# Patient Record
Sex: Male | Born: 1970 | Race: White | Hispanic: No | Marital: Married | Smoking: Former smoker
Health system: Southern US, Community
[De-identification: ages and names within clinical notes are randomized; demographics above are authoritative.]

## PROBLEM LIST (undated history)

## (undated) DIAGNOSIS — E785 Hyperlipidemia, unspecified: Secondary | ICD-10-CM

## (undated) HISTORY — PX: KNEE RECONSTRUCTION: SHX5883

## (undated) HISTORY — PX: HERNIA REPAIR: SHX51

---

## 2008-02-06 ENCOUNTER — Encounter: Admission: RE | Admit: 2008-02-06 | Discharge: 2008-02-06 | Payer: Self-pay | Admitting: Orthopedic Surgery

## 2008-02-13 ENCOUNTER — Encounter: Admission: RE | Admit: 2008-02-13 | Discharge: 2008-02-13 | Payer: Self-pay | Admitting: Orthopedic Surgery

## 2008-06-06 ENCOUNTER — Emergency Department (HOSPITAL_COMMUNITY): Admission: EM | Admit: 2008-06-06 | Discharge: 2008-06-06 | Payer: Self-pay | Admitting: Emergency Medicine

## 2009-10-14 IMAGING — CT CT EXTREM UP W/O CM*L*
2 of 3 series · 7 of 14 positions shown, 8 images · non-contrast
Comparison: MRI 02/06/2008.

CLINICAL DATA: Cystic lesion identified by MRI.  Shoulder pain.

CT RIGHT SHOULDER WITHOUT CONTRAST
TECHNIQUE: Multidetector CT imaging of the right shoulder was
performed according to the standard protocol without intravenous
contrast. Multiplanar CT image reconstructions were also generated.

[Series 3: shoulder/standard · axial · 0.47mm/px · z∈[-34,+37]mm · 4 of 97 slices shown]
[im 20/97  soft-tissue]
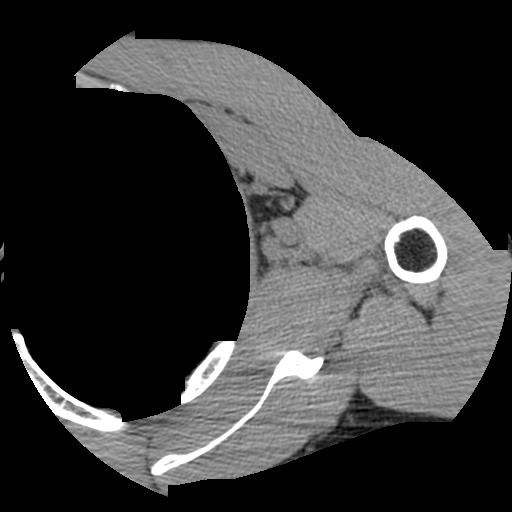
[im 39/97  soft-tissue]
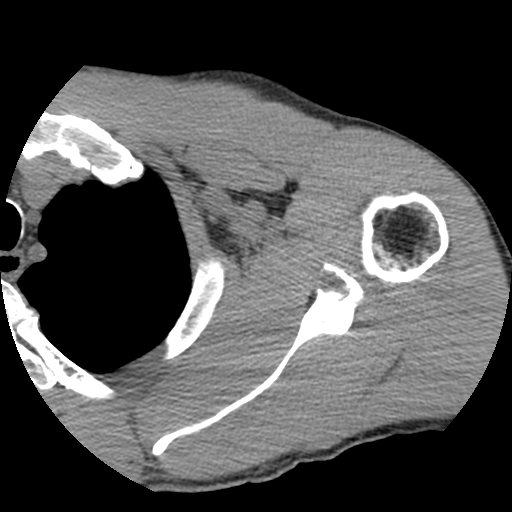
[im 58/97  soft-tissue]
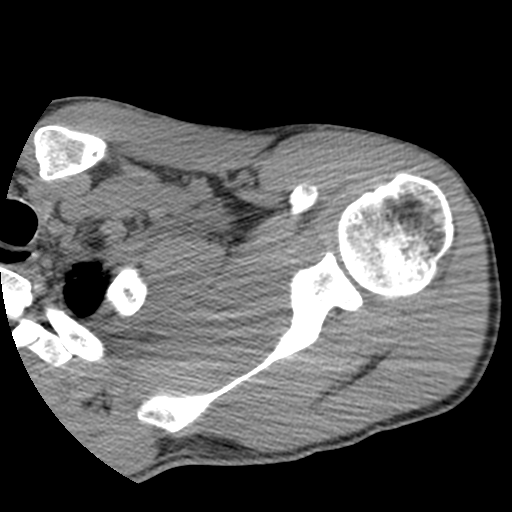
[im 77/97  soft-tissue]
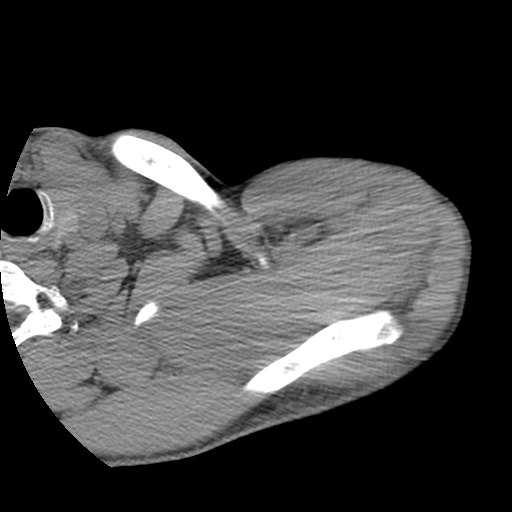

[Series 400: sagittal · oblique · 0.47mm/px · 3 of 80 slices shown, 4 images]
[im 20/80  soft-tissue]
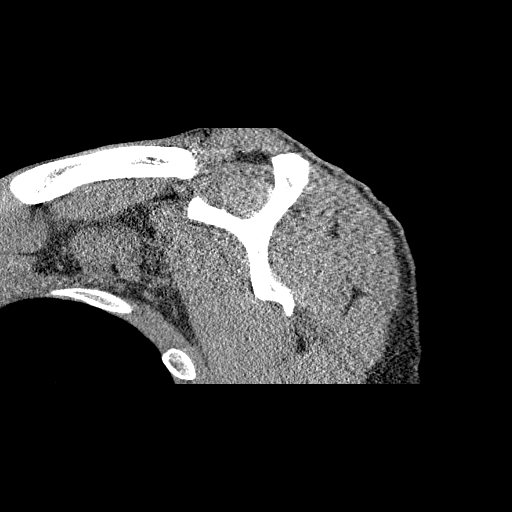
[im 20/80  bone]
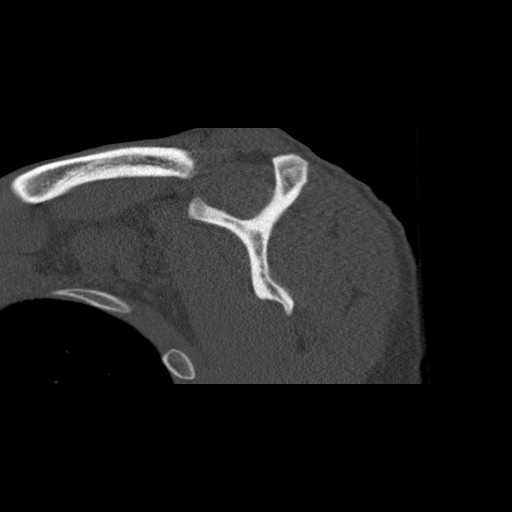
[im 40/80  bone]
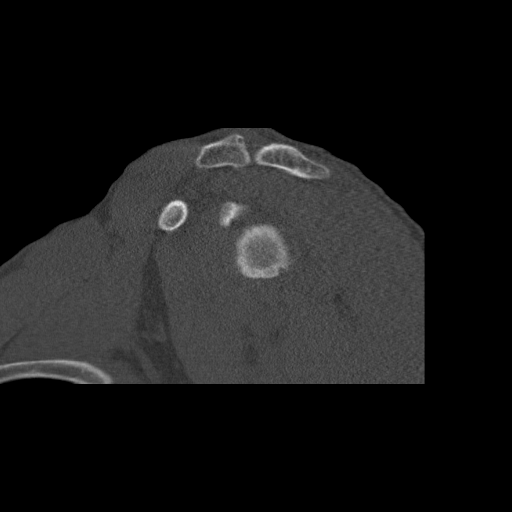
[im 60/80  bone]
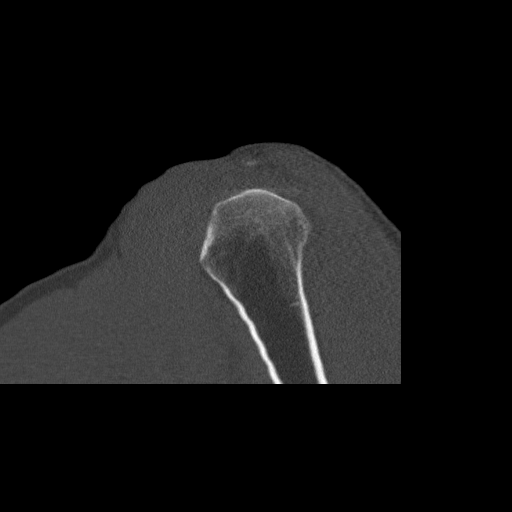

[7 of 14 positions shown; findings below may reference images not displayed]

FINDINGS: As seen on the patient's MRI scan, there is a cyst in
the anterior inferior glenoid bone which could represent an
intraosseous geode which has become unroofed anteriorly or an
intraosseous paralabral cyst.  The cyst extends from the 3 o'clock
position of the anterior glenoid to the 6 o'clock position of the
inferior glenoid.  It measures 1.7 cm AP by 1.3 cm transverse by
1.3 cm cranial-caudal.  The cortex of the anterior surface of the
glenoid neck is absent with a gap in the cortex measuring 1.2 cm
transverse by approximately 1.1 cm A P identified.  Remainder the
glenoid appears normal.  There is osteophytosis off the humeral
head.  Ribs appear normal.  Imaged lung parenchyma is clear.
IMPRESSION: 1.  Cyst involving the anterior inferior [REDACTED] of the glenoid as
detailed above.  As noted, the cortex of the anterior aspect of the
glenoid neck is absent. Thin rim of cortical at the articular
surface of the glenohumeral joint is present.  Cyst likely
represents an intraosseous geode or paralabral cyst which has
eroded into bone.

## 2010-06-13 NOTE — Consult Note (Signed)
Brett Wall, Brett Wall NO.:  [id number]   MEDICAL RECORD NO.:  [id number]          PATIENT TYPE:  EMS   LOCATION:  ED                           FACILITY:  [facility]   PHYSICIAN:  Jefry H. Pollyann Kennedy, MD     DATE OF BIRTH:  Sep 24, 1970   DATE OF CONSULTATION:  06/06/2008  DATE OF DISCHARGE:                                 CONSULTATION   TIME OF SERVICE:  10:45 a.m.   REASON FOR CONSULTATION:  Possible peritonsillar abscess.   HISTORY:  This is a 40 year old gentleman with a 1-week history of right-  sided sore throat.  He has a history of having had what sounds like 2  previous peritonsillar abscesses that drained spontaneously.  One was  about 20 years ago.  One was about 10 years ago.  He has a history of  smoking with a significant medical history.   PHYSICAL EXAMINATION:  GENERAL:  A healthy-appearing in no distress. He  has no trouble with secretions and no difficulty breathing.  HEENT:  He has no palpable neck masses.  Nasal exam unremarkable. Oral  cavity and pharynx reveal no trismus. There is hypertrophy of the right  tonsil without exudate.  There is significant soft tissue edema of the  right-sided soft palate with displacement of the tonsil toward the  midline.   IMPRESSION:  Probable peritonsillar abscess, recurrent.   PLAN:  To perform incision and drainage.   PROCEDURE NOTE:  The right soft palate was infiltrated with Xylocaine  with epinephrine for anesthesia.  An 18-gauge needle was used to  aspirate the peritonsillar space and a large abscess cavity was  identified, and 3-4 mL of pus was obtained.  An 11 scalpel was used to  incise the mucosa, and hemostat was used to open into the abscess  cavity.  Residual purulent secretions were obtained, and then there was  no further. He tolerated  this very well. There was minimal bleeding.   IMPRESSION:  Status post incision and drainage of peritonsillar abscess.   PLAN:  The plan is to start him on  Augmentin 875 mg p.o. b.i.d. for 10  days. He has been instructed to gargle with warm saline as needed.  He  may resume a regular diet as tolerated.  He has not complained of any  pain, so we are going to hold off on any prescription analgesics.  He  will follow up with me in the office in a couple of days, sooner if  needed. Eventually, recommend to have his tonsils out.      Jefry H. Pollyann Kennedy, MD  Electronically Signed     JHR/MEDQ  D:  06/06/2008  T:  06/06/2008  Job:  [id number]

## 2013-12-16 ENCOUNTER — Emergency Department
Admission: EM | Admit: 2013-12-16 | Discharge: 2013-12-16 | Disposition: A | Discharge status: Home / Self Care | Payer: BC Managed Care – PPO | Source: Home / Self Care | Attending: Family Medicine | Admitting: Family Medicine

## 2013-12-16 ENCOUNTER — Encounter: Payer: Self-pay | Admitting: *Deleted

## 2013-12-16 DIAGNOSIS — J029 Acute pharyngitis, unspecified: Secondary | ICD-10-CM

## 2013-12-16 HISTORY — DX: Hyperlipidemia, unspecified: E78.5

## 2013-12-16 LAB — POCT RAPID STREP A (OFFICE): Rapid Strep A Screen: NEGATIVE

## 2013-12-16 NOTE — Discharge Instructions (Signed)
May take Ibuprofen 200mg , 4 tabs every 8 hours with food for sore throat, fever, etc. Try warm salt water gargles for sore throat.   If cold symptoms develop, suggest the following. Take plain Mucinex (1200 mg guaifenesin) twice daily for cough and congestion.  May add Sudafed for sinus congestion.   Increase fluid intake, rest. May use Afrin nasal spray (or generic oxymetazoline) twice daily for about 5 days.  Also recommend using saline nasal spray several times daily and saline nasal irrigation (AYR is a common brand) Stop all antihistamines for now, and other non-prescription cough/cold preparations. May take Delsym Cough Suppressant at bedtime for nighttime cough.    Follow-up with family doctor if not improving 7 to 10 days.

## 2013-12-16 NOTE — ED Notes (Signed)
Pt c/o sore throat and RT ear ache x 4 days. Denies fever.

## 2013-12-16 NOTE — ED Provider Notes (Signed)
CSN: [id number]     Arrival date & time 12/16/13  [id number] History   First MD Initiated Contact with Patient 12/16/13 [phone]     Chief Complaint  Patient presents with  . Sore Throat  . Otalgia      HPI Comments: Patient complains of onset of a sore throat four days ago that has improved.  He has also had some pain in his right ear.  No other respiratory symptoms.  The history is provided by the patient.    Past Medical History  Diagnosis Date  . Hyperlipidemia    Past Surgical History  Procedure Laterality Date  . Hernia repair    . Knee reconstruction     Family History  Problem Relation Age of Onset  . Heart disease Mother    History  Substance Use Topics  . Smoking status: Former Games developermoker  . Smokeless tobacco: Not on file  . Alcohol Use: No    Review of Systems + sore throat No cough No pleuritic pain No wheezing No nasal congestion ? post-nasal drainage No sinus pain/pressure No itchy/red eyes ? Right earache No hemoptysis No SOB No fever, ? chills No nausea No vomiting No abdominal pain No diarrhea No urinary symptoms No skin rash + fatigue No myalgias No headache Used OTC meds without relief  Allergies  Review of patient's allergies indicates no known allergies.  Home Medications   Prior to Admission medications   Not on File   BP 121/74 mmHg  Pulse 55  Temp(Src) 97.4 F (36.3 C) (Oral)  Resp 18  Ht 6' (1.829 m)  Wt 203 lb (92.08 kg)  BMI 27.53 kg/m2  SpO2 98% Physical Exam Nursing notes and Vital Signs reviewed. Appearance:  Patient appears healthy, stated age, and in no acute distress Eyes:  Pupils are equal, round, and reactive to light and accomodation.  Extraocular movement is intact.  Conjunctivae are not inflamed  Ears:  Canals normal.  Tympanic membranes normal.  Nose:  Mildly congested turbinates.  No sinus tenderness.   Pharynx:  Mildly erythematous with symmetrically enlarged tonsils. Neck:  Supple.  No adenopathy Lungs:   Clear to auscultation.  Breath sounds are equal.  Heart:  Regular rate and rhythm without murmurs, rubs, or gallops.  Abdomen:  Nontender without masses or hepatosplenomegaly.  Bowel sounds are present.  No CVA or flank tenderness.  Extremities:  No edema.  No calf tenderness Skin:  No rash present.   ED Course  Procedures  None    Labs Reviewed  STREP A DNA PROBE  POCT RAPID STREP A (OFFICE) negative         MDM   1. Acute pharyngitis, unspecified pharyngitis type; suspect early viral URI    There is no evidence of bacterial infection today.   Throat culture pending. May take Ibuprofen 200mg , 4 tabs every 8 hours with food for sore throat, fever, etc. Try warm salt water gargles for sore throat.   If cold symptoms develop, suggest the following. Take plain Mucinex (1200 mg guaifenesin) twice daily for cough and congestion.  May add Sudafed for sinus congestion.   Increase fluid intake, rest. May use Afrin nasal spray (or generic oxymetazoline) twice daily for about 5 days.  Also recommend using saline nasal spray several times daily and saline nasal irrigation (AYR is a common brand) Stop all antihistamines for now, and other non-prescription cough/cold preparations. May take Delsym Cough Suppressant at bedtime for nighttime cough.    Follow-up with family doctor  if not improving 7 to 10 days.     Lattie HawStephen A Lacye Mccarn, MD 12/18/13 1536

## 2013-12-17 LAB — STREP A DNA PROBE: GASP: NEGATIVE

## 2013-12-31 ENCOUNTER — Emergency Department (INDEPENDENT_AMBULATORY_CARE_PROVIDER_SITE_OTHER): Payer: BC Managed Care – PPO

## 2013-12-31 ENCOUNTER — Encounter: Payer: Self-pay | Admitting: *Deleted

## 2013-12-31 ENCOUNTER — Emergency Department
Admission: EM | Admit: 2013-12-31 | Discharge: 2013-12-31 | Disposition: A | Discharge status: Home / Self Care | Payer: BC Managed Care – PPO | Source: Home / Self Care | Attending: Emergency Medicine | Admitting: Emergency Medicine

## 2013-12-31 DIAGNOSIS — R52 Pain, unspecified: Secondary | ICD-10-CM

## 2013-12-31 DIAGNOSIS — M25512 Pain in left shoulder: Secondary | ICD-10-CM

## 2013-12-31 DIAGNOSIS — M7542 Impingement syndrome of left shoulder: Secondary | ICD-10-CM

## 2013-12-31 MED ORDER — HYDROCODONE-ACETAMINOPHEN 5-325 MG PO TABS: 1.0000 | ORAL_TABLET | ORAL | Status: DC | PRN

## 2013-12-31 MED ORDER — PREDNISONE (PAK) 10 MG PO TABS: ORAL_TABLET | ORAL | Status: DC

## 2013-12-31 NOTE — ED Notes (Signed)
Brett Wall has a hx of a tear of his left labrum. Today while driving he developed shooting sharp intermittent pain down his left arm also causing numbness.

## 2013-12-31 NOTE — ED Provider Notes (Signed)
CSN: [id number]     Arrival date & time 12/31/13  1833 History   First MD Initiated Contact with Patient 12/31/13 1842     Chief Complaint  Patient presents with  . Shoulder Pain    left   (Consider location/radiation/quality/duration/timing/severity/associated sxs/prior Treatment) HPI Earlier today, lifted weights at the gym. He felt soreness and sharp pain left shoulder. Then, while driving he developed severe shooting sharp, intermittent pain down his left arm also causing numbness. worsens when changes position of left shoulder and left upper extremity. Improve somewhat when he doesn't move left shoulder and arm.  Denies chest pain, shortness of breath, cough, fever, chills, nausea, vomiting, headache, syncope, G.I. Symptoms. Past Medical History  Diagnosis Date  . Hyperlipidemia    Past Surgical History  Procedure Laterality Date  . Hernia repair    . Knee reconstruction     Family History  Problem Relation Age of Onset  . Heart disease Mother    History  Substance Use Topics  . Smoking status: Former Games developermoker  . Smokeless tobacco: Never Used  . Alcohol Use: No    Review of Systems  All other systems reviewed and are negative.   Allergies  Review of patient's allergies indicates no known allergies.  Home Medications   Prior to Admission medications   Medication Sig Start Date End Date Taking? Authorizing Provider  HYDROcodone-acetaminophen (NORCO/VICODIN) 5-325 MG per tablet Take 1-2 tablets by mouth every 4 (four) hours as needed for severe pain. Take with food. 12/31/13   Lajean Manesavid Massey, MD  predniSONE (STERAPRED UNI-PAK) 10 MG tablet Take as directed for 6 days. 12/31/13   Lajean Manesavid Massey, MD   BP 122/76 mmHg  Pulse 76  Resp 14  Wt 207 lb (93.895 kg)  SpO2 99% Physical Exam  Constitutional: He is oriented to person, place, and time. He appears well-developed and well-nourished. No distress.  He is comfortable at rest, but in pain when he moves his left upper  extremity/shoulder  HENT:  Head: Normocephalic and atraumatic.  Eyes: Conjunctivae and EOM are normal. Pupils are equal, round, and reactive to light. No scleral icterus.  Neck: Normal range of motion.  Cardiovascular: Normal rate.   Pulmonary/Chest: Effort normal.  Abdominal: He exhibits no distension.  Musculoskeletal:       Left shoulder: He exhibits decreased range of motion, tenderness and bony tenderness. He exhibits no deformity and normal pulse.       Cervical back: Normal.       Thoracic back: Normal.  Neurological: He is alert and oriented to person, place, and time. He has normal strength. No sensory deficit.  Skin: Skin is warm. No rash noted.  Psychiatric: He has a normal mood and affect.  Nursing note and vitals reviewed.   ED Course  Procedures (including critical care time) Labs Review Labs Reviewed - No data to display  Imaging Review Dg Shoulder Left  12/31/2013   CLINICAL DATA:  Acute onset left shoulder pain.  No known injury.  EXAM: LEFT SHOULDER - 2+ VIEW  COMPARISON:  None.  FINDINGS: There is no evidence of fracture or dislocation. Mild to moderate degenerative spurring is seen along the inferior aspect of the glenohumeral joint. Mild degenerative spurring of the distal clavicle also noted.  IMPRESSION: No acute findings.  Glenohumeral and acromioclavicular degenerative changes.   Electronically Signed   By: Myles RosenthalJohn  Stahl M.D.   On: 12/31/2013 19:26     MDM   1. Impingement syndrome of left shoulder region  2. Pain    Treatment options discussed, as well as risks, benefits, alternatives. Patient voiced understanding and agreement with the following plans: Left sling/shoulder immobilizer provided. Encourage rest, ice,  Vicodin and prednisone Dosepak prescribed Discharge Medication List as of 12/31/2013  8:07 PM    START taking these medications   Details  HYDROcodone-acetaminophen (NORCO/VICODIN) 5-325 MG per tablet Take 1-2 tablets by mouth every 4  (four) hours as needed for severe pain. Take with food., Starting 12/31/2013, Until Discontinued, Print    predniSONE (STERAPRED UNI-PAK) 10 MG tablet Take as directed for 6 days., Print       Follow-up with orthopedist or sports medicine in 24-48 hours Precautions discussed. Red flags discussed. Questions invited and answered. Patient voiced understanding and agreement.     Lajean Manesavid Massey, MD 01/02/14 1037

## 2014-03-04 DIAGNOSIS — M25562 Pain in left knee: Secondary | ICD-10-CM | POA: Insufficient documentation

## 2014-09-28 ENCOUNTER — Encounter: Payer: Self-pay | Admitting: *Deleted

## 2014-09-28 ENCOUNTER — Emergency Department
Admission: EM | Admit: 2014-09-28 | Discharge: 2014-09-28 | Disposition: A | Discharge status: Home / Self Care | Payer: BLUE CROSS/BLUE SHIELD | Source: Home / Self Care | Attending: Family Medicine | Admitting: Family Medicine

## 2014-09-28 DIAGNOSIS — J020 Streptococcal pharyngitis: Secondary | ICD-10-CM | POA: Diagnosis not present

## 2014-09-28 LAB — POCT RAPID STREP A (OFFICE): Rapid Strep A Screen: POSITIVE — AB

## 2014-09-28 MED ORDER — PENICILLIN V POTASSIUM 500 MG PO TABS: ORAL_TABLET | ORAL | Status: DC

## 2014-09-28 NOTE — ED Notes (Addendum)
Pt c/o 2 days of sore throat, chills, HA and body aches. Taken 2 doses of previous Amoxicillin Rx.

## 2014-09-28 NOTE — ED Provider Notes (Signed)
CSN: [id number]     Arrival date & time 09/28/14  0848 History   First MD Initiated Contact with Patient 09/28/14 [phone]     Chief Complaint  Patient presents with  . Sore Throat      HPI Comments: Patient complains of sudden onset of fatigue, fever/chills, and myalgias two days ago.  Yesterday he developed a sore throat that has become worse today.  No nasal congestion or cough.  The history is provided by the patient.    Past Medical History  Diagnosis Date  . Hyperlipidemia    Past Surgical History  Procedure Laterality Date  . Hernia repair    . Knee reconstruction     Family History  Problem Relation Age of Onset  . Heart disease Mother    Social History  Substance Use Topics  . Smoking status: Former Games developer  . Smokeless tobacco: Never Used  . Alcohol Use: No    Review of Systems + sore throat No cough No pleuritic pain No wheezing No nasal congestion No post-nasal drainage No sinus pain/pressure No itchy/red eyes No earache No hemoptysis No SOB +  fever, + chills No nausea No vomiting No abdominal pain No diarrhea No urinary symptoms No skin rash + fatigue + myalgias + headache Used OTC meds without relief  Allergies  Review of patient's allergies indicates no known allergies.  Home Medications   Prior to Admission medications   Medication Sig Start Date End Date Taking? Authorizing Provider  penicillin v potassium (VEETID) 500 MG tablet Take one tab by mouth twice daily for 10 days 09/28/14   Lattie Haw, MD   Meds Ordered and Administered this Visit  Medications - No data to display  BP 124/74 mmHg  Pulse 64  Temp(Src) 98.4 F (36.9 C) (Oral)  Resp 14  Wt 197 lb (89.359 kg)  SpO2 100% No data found.   Physical Exam Nursing notes and Vital Signs reviewed. Appearance:  Patient appears stated age, and in no acute distress Eyes:  Pupils are equal, round, and reactive to light and accomodation.  Extraocular movement is intact.   Conjunctivae are not inflamed  Ears:  Canals normal.  Tympanic membranes normal.  Nose:  Mildly congested turbinates.  No sinus tenderness.   Pharynx:  Erythematous and slightly swollen without obstruction.  Uvula edematous and erythematous Neck:  Supple.  Tender enlarged anterior nodes. Lungs:  Clear to auscultation.  Breath sounds are equal.  Moving air well. Heart:  Regular rate and rhythm without murmurs, rubs, or gallops.  Abdomen:  Nontender  Extremities:  No edema.  No calf tenderness Skin:  No rash present.   ED Course  Procedures  None    Labs Reviewed  POCT RAPID STREP A (OFFICE) - Abnormal; Notable for the following:    Rapid Strep A Screen Positive (*)    All other components within normal limits      MDM   1. Strep pharyngitis    Begin PenVK  BID for 10 days.  Try warm salt water gargles for sore throat.  May take Ibuprofen , 4 tabs every 8 hours with food.  Rest.  Increase fluid intake. Followup with Family Doctor if not improved in one week.     Lattie Haw, MD 09/28/14 0930

## 2014-09-28 NOTE — Discharge Instructions (Signed)
Try warm salt water gargles for sore throat.  May take Ibuprofen , 4 tabs every 8 hours with food.  Rest.  Increase fluid intake.   Salt Water Gargle This solution will help make your mouth and throat feel better. HOME CARE INSTRUCTIONS   Mix 1 teaspoon of salt in 8 ounces of warm water.  Gargle with this solution as much or often as you need or as directed. Swish and gargle gently if you have any sores or wounds in your mouth.  Do not swallow this mixture. Document Released: 10/20/2003 Document Revised: 04/09/2011 Document Reviewed: 03/12/2008 [facility] Patient Information 2015 [place], [place]. This information is not intended to replace advice given to you by your health care provider. Make sure you discuss any questions you have with your health care provider.

## 2014-12-01 ENCOUNTER — Encounter: Payer: Self-pay | Admitting: *Deleted

## 2014-12-01 ENCOUNTER — Emergency Department
Admission: EM | Admit: 2014-12-01 | Discharge: 2014-12-01 | Disposition: A | Discharge status: Home / Self Care | Payer: BLUE CROSS/BLUE SHIELD | Source: Home / Self Care | Attending: Family Medicine | Admitting: Family Medicine

## 2014-12-01 DIAGNOSIS — H6503 Acute serous otitis media, bilateral: Secondary | ICD-10-CM

## 2014-12-01 MED ORDER — AMOXICILLIN 875 MG PO TABS: 875.0000 mg | ORAL_TABLET | Freq: Two times a day (BID) | ORAL | Status: DC

## 2014-12-01 MED ORDER — PREDNISONE 20 MG PO TABS: 20.0000 mg | ORAL_TABLET | Freq: Two times a day (BID) | ORAL | Status: DC

## 2014-12-01 NOTE — Discharge Instructions (Signed)
Take Pseudoephedrine (30mg , one or two every 4 to 6 hours) for sinus congestion.     May use Afrin nasal spray (or generic oxymetazoline) twice daily for about 5 days and then discontinue.  Also recommend using saline nasal spray several times daily and saline nasal irrigation (AYR is a common brand).

## 2014-12-01 NOTE — ED Notes (Signed)
Pt c/o "fluid feeling" in his ears bilaterally x 1 wk after flying back from [place]. He has taken Sudafed with no relief. Reports recent URI. Denies fever.

## 2014-12-01 NOTE — ED Provider Notes (Signed)
CSN: [id number]     Arrival date & time 12/01/14  0901 History   First MD Initiated Contact with Patient 12/01/14 [phone]     Chief Complaint  Patient presents with  . Ear Fullness      HPI Comments: Patient reports that he had difficulty equalizing pressure in his ears while flying to [place] two weeks ago.  He then developed a cold that has now resolved.  However, his ears now feel clogged, worse on the right.  The history is provided by the patient.    Past Medical History  Diagnosis Date  . Hyperlipidemia    Past Surgical History  Procedure Laterality Date  . Hernia repair    . Knee reconstruction     Family History  Problem Relation Age of Onset  . Heart disease Mother    Social History  Substance Use Topics  . Smoking status: Former Games developermoker  . Smokeless tobacco: Never Used  . Alcohol Use: No    Review of Systems No sore throat Nocough No pleuritic pain No wheezing + nasal congestion No post-nasal drainage No sinus pain/pressure No itchy/red eyes No earache, but ears feel clogged No hemoptysis No SOB No fever/chills No nausea No vomiting No abdominal pain No diarrhea No urinary symptoms No skin rash No fatigue No myalgias Noheadache Used OTC meds without relief  Allergies  Review of patient's allergies indicates no known allergies.  Home Medications   Prior to Admission medications   Medication Sig Start Date End Date Taking? Authorizing Provider  amoxicillin (AMOXIL) 875 MG tablet Take 1 tablet (875 mg total) by mouth 2 (two) times daily. 12/01/14   Lattie HawStephen A Madix Blowe, MD  predniSONE (DELTASONE) 20 MG tablet Take 1 tablet (20 mg total) by mouth 2 (two) times daily. Take with food. 12/01/14   Lattie HawStephen A Alastor Kneale, MD   Meds Ordered and Administered this Visit  Medications - No data to display  BP 135/81 mmHg  Pulse 54  Temp(Src) 98.1 F (36.7 C) (Oral)  Resp 16  Ht 6' (1.829 m)  Wt 204 lb (92.534 kg)  BMI 27.66 kg/m2  SpO2 98% No data  found.   Physical Exam Nursing notes and Vital Signs reviewed. Appearance:  Patient appears stated age, and in no acute distress Eyes:  Pupils are equal, round, and reactive to light and accomodation.  Extraocular movement is intact.  Conjunctivae are not inflamed  Ears:  Canals normal.  Right tympanic membrane bulging with blush of erythema.  Left tympanic membrane has serous effusion. Nose:  Mildly congested turbinates.  No sinus tenderness.   Pharynx:  Normal Neck:  Supple.  No adenopathy.     ED Course  Procedures  none  MDM   1. Bilateral acute serous otitis media, recurrence not specified    Begin amoxicillin 875mg  BID, and prednisone burst. Take Pseudoephedrine (30mg , one or two every 4 to 6 hours) for sinus congestion.     May use Afrin nasal spray (or generic oxymetazoline) twice daily for about 5 days and then discontinue.  Also recommend using saline nasal spray several times daily and saline nasal irrigation (AYR is a common brand).  Followup with ENT if not resolved 10 days.    Lattie HawStephen A Armand Preast, MD 12/01/14 1007

## 2015-06-09 DIAGNOSIS — E7800 Pure hypercholesterolemia, unspecified: Secondary | ICD-10-CM | POA: Diagnosis not present

## 2015-06-09 DIAGNOSIS — Z0000 Encounter for general adult medical examination without abnormal findings: Secondary | ICD-10-CM | POA: Diagnosis not present

## 2015-09-01 IMAGING — CR DG SHOULDER 2+V*L*
4 series · 4 of 4 positions shown · non-contrast
Comparison: None.

CLINICAL DATA: Acute onset left shoulder pain.  No known injury.

EXAM:
LEFT SHOULDER - 2+ VIEW

[view not recorded (1 of 4)]
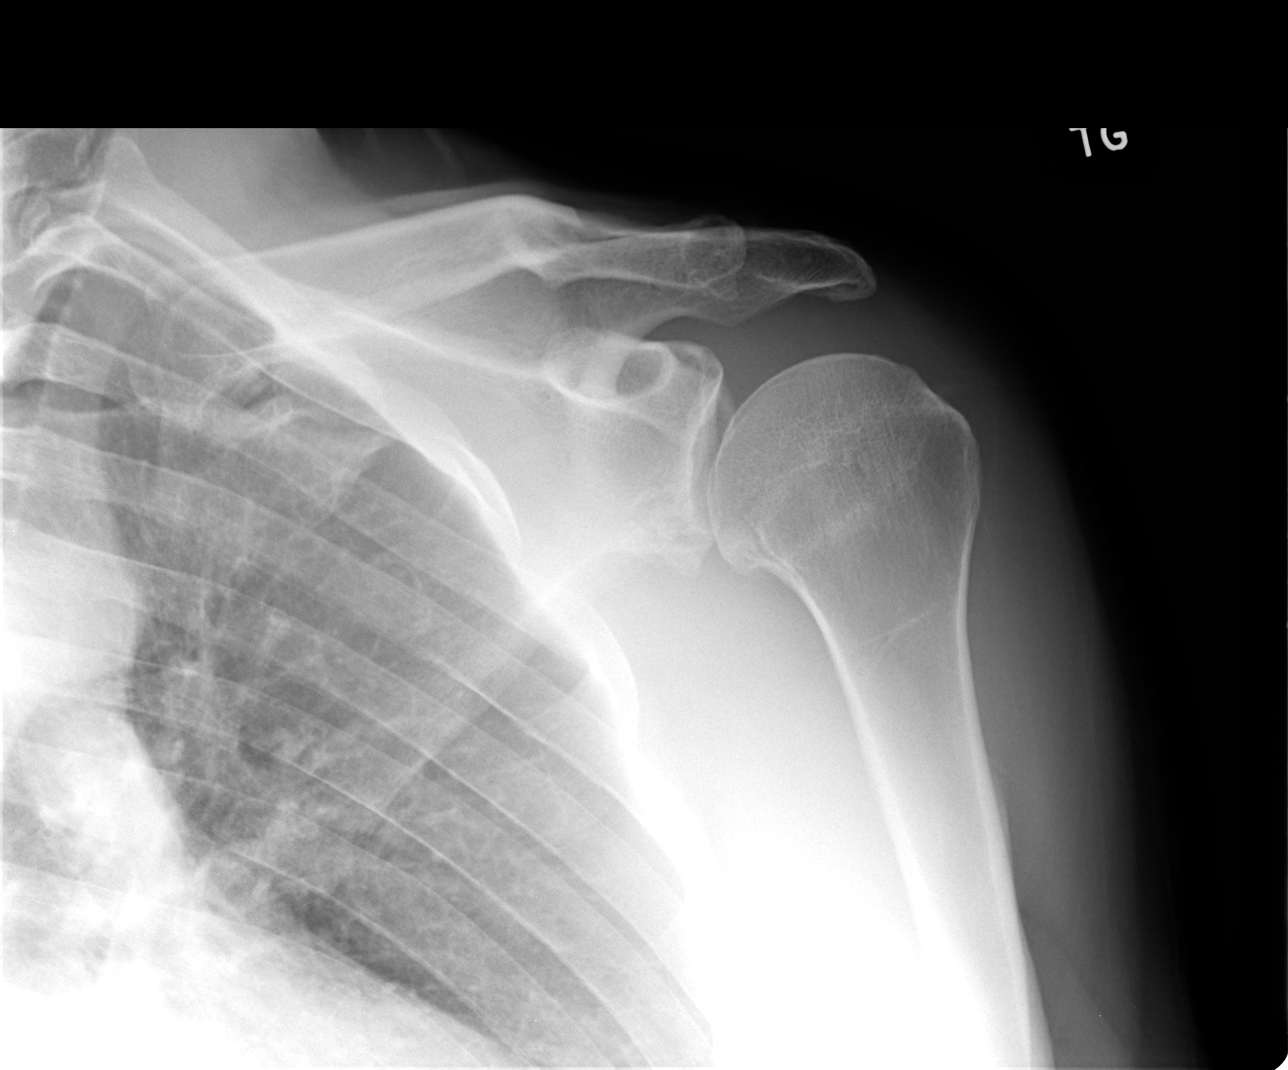

[view not recorded (2 of 4)]
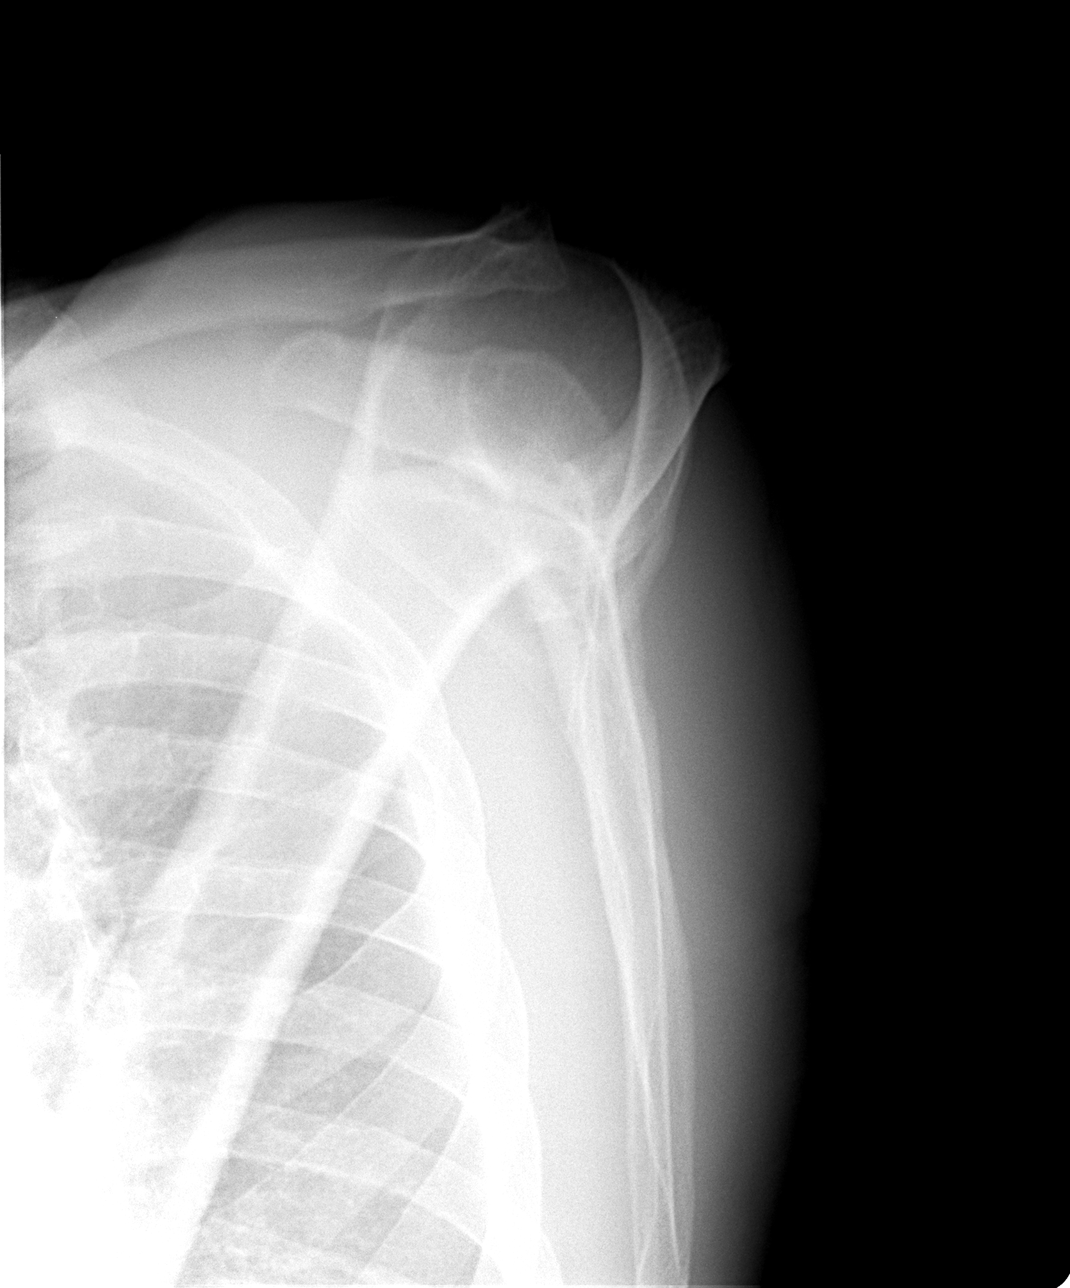

[view not recorded (3 of 4)]
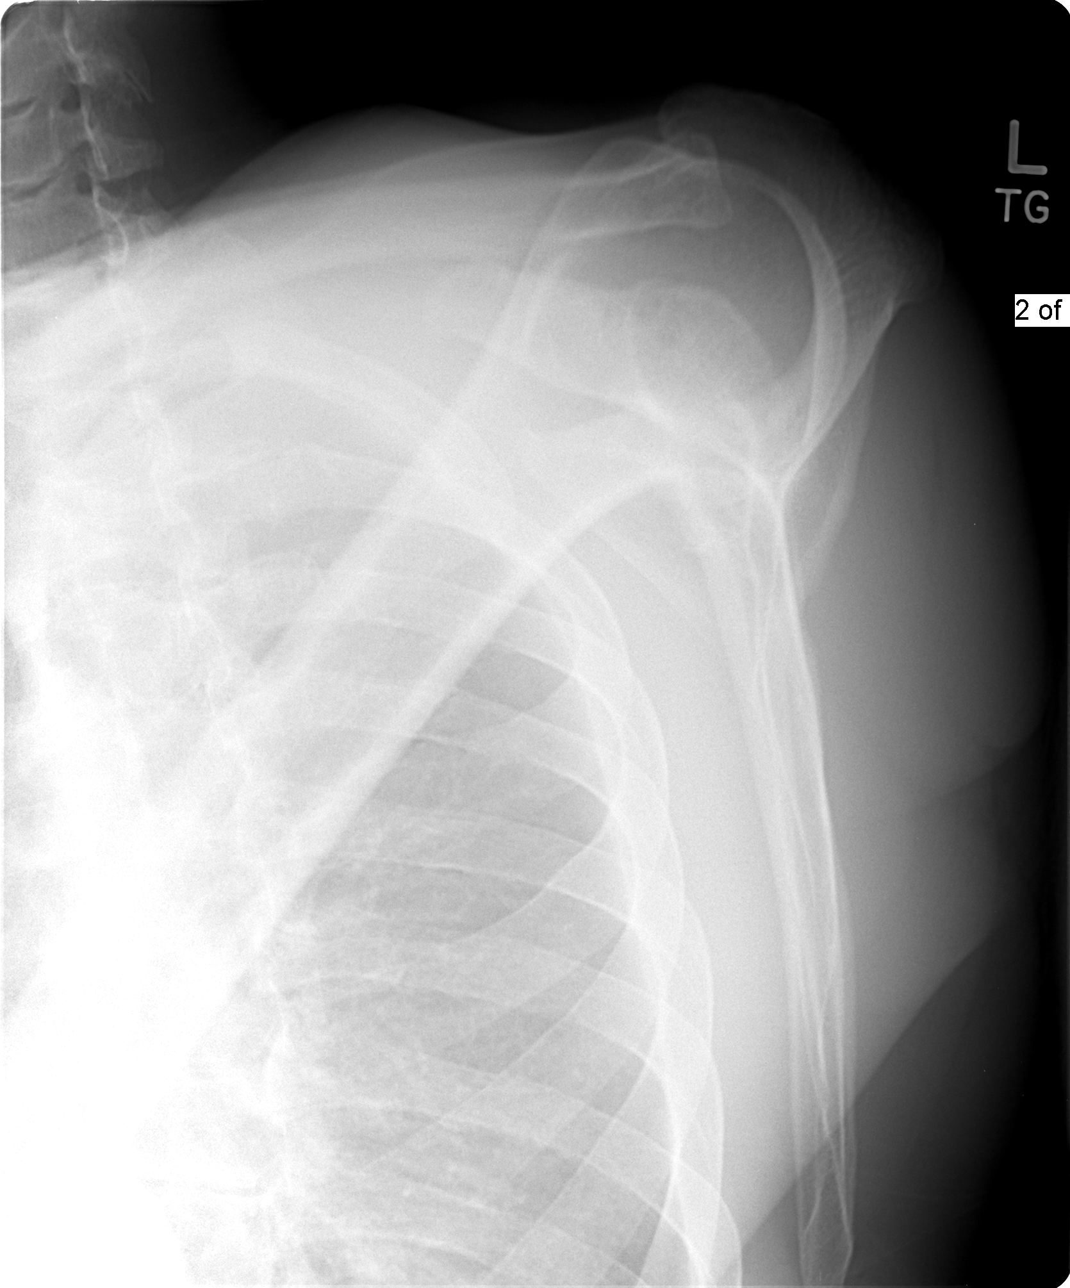

[view not recorded (4 of 4)]
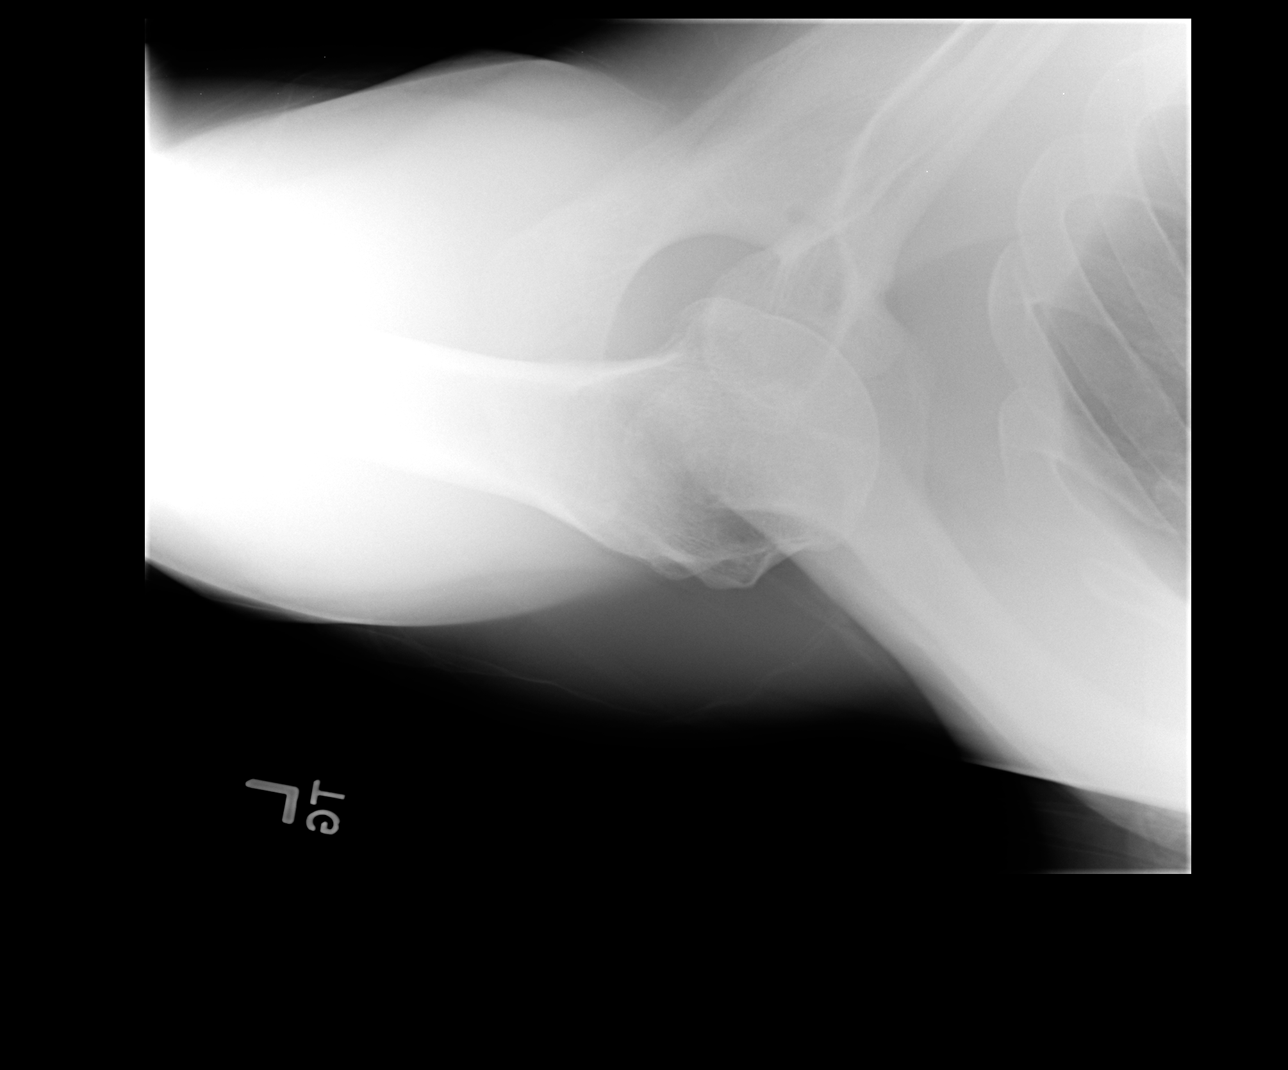

[4 of 4 positions shown; findings below may reference images not displayed]

FINDINGS: There is no evidence of fracture or dislocation. Mild to moderate
degenerative spurring is seen along the inferior aspect of the
glenohumeral joint. Mild degenerative spurring of the distal
clavicle also noted.
IMPRESSION: No acute findings.

Glenohumeral and acromioclavicular degenerative changes.

## 2015-12-12 ENCOUNTER — Emergency Department
Admission: EM | Admit: 2015-12-12 | Discharge: 2015-12-12 | Disposition: A | Discharge status: Home / Self Care | Payer: BLUE CROSS/BLUE SHIELD | Source: Home / Self Care | Attending: Family Medicine | Admitting: Family Medicine

## 2015-12-12 ENCOUNTER — Encounter: Payer: Self-pay | Admitting: *Deleted

## 2015-12-12 DIAGNOSIS — J020 Streptococcal pharyngitis: Secondary | ICD-10-CM

## 2015-12-12 DIAGNOSIS — H9203 Otalgia, bilateral: Secondary | ICD-10-CM

## 2015-12-12 LAB — POCT RAPID STREP A (OFFICE): RAPID STREP A SCREEN: POSITIVE — AB

## 2015-12-12 MED ORDER — AMOXICILLIN 500 MG PO CAPS: 500.0000 mg | ORAL_CAPSULE | Freq: Two times a day (BID) | ORAL | 0 refills | Status: DC

## 2015-12-12 NOTE — Discharge Instructions (Signed)

## 2015-12-12 NOTE — ED Provider Notes (Signed)
CSN: [id number]     Arrival date & time 12/12/15  1755 History   First MD Initiated Contact with Patient 12/12/15 1836     Chief Complaint  Patient presents with  . Otalgia  . Sore Throat   (Consider location/radiation/quality/duration/timing/severity/associated sxs/prior Treatment) HPI  Brett Wall is a 45 y.o. male presenting to UC with c/o 2-3 days of sore throat and bilateral ear pain, worse in Right ear.  Throat pain is aching and sore, 7/10 at worst, worse with swallowing but he is able to keep down fluids. Denies fever, chills, n/v/d. Minimal cough. Denies headache. No sick contacts or recent travel.  He did have strep throat around Fall/Winter of last year.    Past Medical History:  Diagnosis Date  . Hyperlipidemia    Past Surgical History:  Procedure Laterality Date  . HERNIA REPAIR    . KNEE RECONSTRUCTION     Family History  Problem Relation Age of Onset  . Heart disease Mother    Social History  Substance Use Topics  . Smoking status: Former Games developermoker  . Smokeless tobacco: Never Used  . Alcohol use No    Review of Systems  Constitutional: Negative for chills and fever.  HENT: Positive for ear pain (R>L) and sore throat. Negative for congestion, trouble swallowing and voice change.   Respiratory: Positive for cough (minimal). Negative for shortness of breath.   Cardiovascular: Negative for chest pain and palpitations.  Gastrointestinal: Negative for abdominal pain, diarrhea, nausea and vomiting.  Musculoskeletal: Negative for arthralgias, back pain and myalgias.  Skin: Negative for rash.  Neurological: Negative for dizziness, light-headedness and headaches.    Allergies  Patient has no known allergies.  Home Medications   Prior to Admission medications   Medication Sig Start Date End Date Taking? Authorizing Provider  amoxicillin (AMOXIL) 500 MG capsule Take 1 capsule (500 mg total) by mouth 2 (two) times daily. For 10 days 12/12/15   Junius FinnerErin O'Malley, PA-C    Meds Ordered and Administered this Visit  Medications - No data to display  BP 124/70 (BP Location: Left Arm)   Pulse (!) 53   Temp 97.9 F (36.6 C) (Oral)   Wt 204 lb (92.5 kg)   SpO2 96%   BMI 27.67 kg/m  No data found.   Physical Exam  Constitutional: He is oriented to person, place, and time. He appears well-developed and well-nourished. No distress.  HENT:  Head: Normocephalic and atraumatic.  Right Ear: Tympanic membrane normal.  Left Ear: Tympanic membrane normal.  Nose: Nose normal.  Mouth/Throat: Uvula is midline and mucous membranes are normal. Posterior oropharyngeal edema and posterior oropharyngeal erythema present. No oropharyngeal exudate or tonsillar abscesses.  Eyes: EOM are normal.  Neck: Normal range of motion. Neck supple.  Cardiovascular: Normal rate and regular rhythm.   Pulmonary/Chest: Effort normal and breath sounds normal. No stridor. No respiratory distress. He has no wheezes. He has no rales.  Musculoskeletal: Normal range of motion.  Lymphadenopathy:    He has cervical adenopathy.  Neurological: He is alert and oriented to person, place, and time.  Skin: Skin is warm and dry. He is not diaphoretic.  Psychiatric: He has a normal mood and affect. His behavior is normal.  Nursing note and vitals reviewed.   Urgent Care Course   Clinical Course     Procedures (including critical care time)  Labs Review Labs Reviewed  POCT RAPID STREP A (OFFICE) - Abnormal; Notable for the following:  Result Value   Rapid Strep A Screen Positive (*)    All other components within normal limits    Imaging Review No results found.   MDM   1. Strep throat   2. Otalgia of both ears    Pt c/o sore throat and bilateral ear pain.    Rapid strep: POSITIVE No evidence of tonsillar abscess at this time.   Rx: Amoxicillin. Home care instructions provided. F/u with PCP in 1 week if not improving, sooner if worsening. Patient verbalized  understanding and agreement with treatment plan.     Junius Finnerrin O'Malley, PA-C 12/12/15 1919

## 2015-12-12 NOTE — ED Triage Notes (Signed)
Pt c/o 2-3 days of sore throat and right ear irritation. Afebrile. Taken ASA otc.

## 2016-02-17 ENCOUNTER — Encounter: Payer: Self-pay | Admitting: Emergency Medicine

## 2016-02-17 ENCOUNTER — Emergency Department
Admission: EM | Admit: 2016-02-17 | Discharge: 2016-02-17 | Disposition: A | Discharge status: Home / Self Care | Payer: BLUE CROSS/BLUE SHIELD | Source: Home / Self Care | Attending: Family Medicine | Admitting: Family Medicine

## 2016-02-17 DIAGNOSIS — H6506 Acute serous otitis media, recurrent, bilateral: Secondary | ICD-10-CM

## 2016-02-17 MED ORDER — PREDNISONE 20 MG PO TABS: ORAL_TABLET | ORAL | 0 refills | Status: DC

## 2016-02-17 MED ORDER — AMOXICILLIN 875 MG PO TABS: 875.0000 mg | ORAL_TABLET | Freq: Two times a day (BID) | ORAL | 0 refills | Status: DC

## 2016-02-17 MED ORDER — IBUPROFEN 600 MG PO TABS
600.0000 mg | ORAL_TABLET | Freq: Once | ORAL | Status: AC
  Administered 2016-02-17: 600 mg via ORAL

## 2016-02-17 NOTE — Discharge Instructions (Signed)
Take plain guaifenesin (1200mg  extended release tabs such as Mucinex) twice daily, with plenty of water, for cough and congestion.  May add Pseudoephedrine (30mg , one or two every 4 to 6 hours) for sinus congestion.  Get adequate rest.   May use Afrin nasal spray (or generic oxymetazoline) twice daily for about 5 days and then discontinue.  Also recommend using saline nasal spray several times daily and saline nasal irrigation (AYR is a common brand).  Use Flonase nasal spray each morning after using Afrin nasal spray and saline nasal irrigation. Try warm salt water gargles for sore throat.  Stop all antihistamines for now, and other non-prescription cough/cold preparations. May take Delsym Cough Suppressant at bedtime for nighttime cough.

## 2016-02-17 NOTE — ED Triage Notes (Signed)
Patient awoke with pain in right ear; has had ear infections in the past and this feels similar. Very fatigued due to sick daughter and time spent in ER last night.

## 2016-02-17 NOTE — ED Provider Notes (Signed)
[facility]    CSN: [id number] Arrival date & time: 02/17/16  2000     History   Chief Complaint Chief Complaint  Patient presents with  . Otalgia    HPI Brett Wall is a 46 y.o. male.   Patient awoke with pain in his right ear, and sinus congestion.  His present discomfort feels similar to ear infections that he has had in the past.   The history is provided by the patient.    Past Medical History:  Diagnosis Date  . Hyperlipidemia     There are no active problems to display for this patient.   Past Surgical History:  Procedure Laterality Date  . HERNIA REPAIR    . KNEE RECONSTRUCTION         Home Medications    Prior to Admission medications   Medication Sig Start Date End Date Taking? Authorizing Provider  amoxicillin (AMOXIL) 875 MG tablet Take 1 tablet (875 mg total) by mouth 2 (two) times daily. 02/17/16   Lattie Haw, MD  predniSONE (DELTASONE) 20 MG tablet Take one tab by mouth twice daily for 5 days, then one daily. Take with food. 02/17/16   Lattie Haw, MD    Family History Family History  Problem Relation Age of Onset  . Heart disease Mother     Social History Social History  Substance Use Topics  . Smoking status: Former Games developer  . Smokeless tobacco: Never Used  . Alcohol use No     Allergies   Patient has no known allergies.   Review of Systems Review of Systems No sore throat No cough No pleuritic pain No wheezing + nasal congestion + post-nasal drainage + sinus pain/pressure No itchy/red eyes + earache No hemoptysis No SOB No fever/chills No nausea No vomiting No abdominal pain No diarrhea No urinary symptoms No skin rash + fatigue No myalgias No headache Used OTC meds without relief   Physical Exam Triage Vital Signs ED Triage Vitals  Enc Vitals Group     BP 02/17/16 2015 122/75     Pulse Rate 02/17/16 2015 63     Resp 02/17/16 2015 16     Temp 02/17/16 2015 97.4 F (36.3 C)   Temp Source 02/17/16 2015 Oral     SpO2 02/17/16 2015 96 %     Weight 02/17/16 2016 203 lb (92.1 kg)     Height 02/17/16 2016 6' (1.829 m)     Head Circumference --      Peak Flow --      Pain Score 02/17/16 2019 1     Pain Loc --      Pain Edu? --      Excl. in GC? --    No data found.   Updated Vital Signs BP 122/75 (BP Location: Left Arm)   Pulse 63   Temp 97.4 F (36.3 C) (Oral)   Resp 16   Ht 6' (1.829 m)   Wt 203 lb (92.1 kg)   SpO2 96%   BMI 27.53 kg/m   Visual Acuity Right Eye Distance:   Left Eye Distance:   Bilateral Distance:    Right Eye Near:   Left Eye Near:    Bilateral Near:     Physical Exam Nursing notes and Vital Signs reviewed. Appearance:  Patient appears stated age, and in no acute distress Eyes:  Pupils are equal, round, and reactive to light and accomodation.  Extraocular movement is intact.  Conjunctivae are not inflamed  Ears:  Canals normal.  Tympanic membranes have serous effusions bilaterally.  Nose:  Mildly congested turbinates.  No sinus tenderness.   Pharynx:  Uvula mildly edematous Neck:  Supple.  Tender enlarged posterior/lateral nodes are palpated bilaterally  Lungs:  Clear to auscultation.  Breath sounds are equal.  Moving air well. Heart:  Regular rate and rhythm without murmurs, rubs, or gallops.  Abdomen:  Nontender without masses or hepatosplenomegaly.  Bowel sounds are present.  No CVA or flank tenderness.  Extremities:  No edema.  Skin:  No rash present.    UC Treatments / Results  Labs (all labs ordered are listed, but only abnormal results are displayed) Labs Reviewed - No data to display  EKG  EKG Interpretation None       Radiology No results found.  Procedures Procedures (including critical care time)  Medications Ordered in UC Medications  ibuprofen (ADVIL,MOTRIN) tablet 600 mg (600 mg Oral Given 02/17/16 2027)     Initial Impression / Assessment and Plan / UC Course  I have reviewed the triage  vital signs and the nursing notes.  Pertinent labs & imaging results that were available during my care of the patient were reviewed by me and considered in my medical decision making (see chart for details).    Suspect early viral URI Begin amoxicilling 875mg  BID, and prednisone burst/taper. Take plain guaifenesin (1200mg  extended release tabs such as Mucinex) twice daily, with plenty of water, for cough and congestion.  May add Pseudoephedrine (30mg , one or two every 4 to 6 hours) for sinus congestion.  Get adequate rest.   May use Afrin nasal spray (or generic oxymetazoline) twice daily for about 5 days and then discontinue.  Also recommend using saline nasal spray several times daily and saline nasal irrigation (AYR is a common brand).  Use Flonase nasal spray each morning after using Afrin nasal spray and saline nasal irrigation. Try warm salt water gargles for sore throat.  Stop all antihistamines for now, and other non-prescription cough/cold preparations. May take Delsym Cough Suppressant at bedtime for nighttime cough.  Followup with Family Doctor if not improved in one week.     Final Clinical Impressions(s) / UC Diagnoses   Final diagnoses:  Recurrent acute serous otitis media of both ears    New Prescriptions New Prescriptions   AMOXICILLIN (AMOXIL) 875 MG TABLET    Take 1 tablet (875 mg total) by mouth 2 (two) times daily.   PREDNISONE (DELTASONE) 20 MG TABLET    Take one tab by mouth twice daily for 5 days, then one daily. Take with food.     Lattie HawStephen A Beese, MD 02/25/16 1501

## 2016-03-29 DIAGNOSIS — M25512 Pain in left shoulder: Secondary | ICD-10-CM | POA: Diagnosis not present

## 2016-05-16 DIAGNOSIS — M19012 Primary osteoarthritis, left shoulder: Secondary | ICD-10-CM | POA: Diagnosis not present

## 2016-07-16 LAB — POCT RAPID STREP A (OFFICE): Rapid Strep A Screen: NEGATIVE

## 2016-07-17 ENCOUNTER — Emergency Department
Admission: EM | Admit: 2016-07-17 | Discharge: 2016-07-17 | Disposition: A | Discharge status: Home / Self Care | Payer: BLUE CROSS/BLUE SHIELD | Source: Home / Self Care | Attending: Family Medicine | Admitting: Family Medicine

## 2016-07-17 ENCOUNTER — Encounter: Payer: Self-pay | Admitting: Emergency Medicine

## 2016-07-17 DIAGNOSIS — J029 Acute pharyngitis, unspecified: Secondary | ICD-10-CM | POA: Diagnosis not present

## 2016-07-17 MED ORDER — PREDNISONE 20 MG PO TABS: ORAL_TABLET | ORAL | 0 refills | Status: DC

## 2016-07-17 MED ORDER — AMOXICILLIN 875 MG PO TABS: 875.0000 mg | ORAL_TABLET | Freq: Two times a day (BID) | ORAL | 0 refills | Status: DC

## 2016-07-17 NOTE — ED Triage Notes (Signed)
Pt c/o sore throat since yesterday. Daughter dx with strep last week.

## 2016-07-17 NOTE — Discharge Instructions (Signed)
If increasing cold-like symptoms develop, try the following: Take plain guaifenesin (1200mg  extended release tabs such as Mucinex) twice daily, with plenty of water, for cough and congestion.  May add Pseudoephedrine (30mg , one or two every 4 to 6 hours) for sinus congestion.  Get adequate rest.   May use Afrin nasal spray (or generic oxymetazoline) each morning for about 5 days and then discontinue.  Also recommend using saline nasal spray several times daily and saline nasal irrigation (AYR is a common brand).  Use Flonase nasal spray each morning after using Afrin nasal spray and saline nasal irrigation. Try warm salt water gargles for sore throat.  Stop all antihistamines for now, and other non-prescription cough/cold preparations. May take Delsym Cough Suppressant at bedtime for nighttime cough.  Follow-up with family doctor if not improving about10 days.

## 2016-07-17 NOTE — ED Provider Notes (Signed)
[facility]    CSN: [id number] Arrival date & time: 07/17/16  1920     History   Chief Complaint Chief Complaint  Patient presents with  . Sore Throat    HPI Brett Wall is a 46 y.o. male.   Patient developed a sore throat and fatigue yesterday.  His daughter had strep pharyngitis last week.      Past Medical History:  Diagnosis Date  . Hyperlipidemia     There are no active problems to display for this patient.   Past Surgical History:  Procedure Laterality Date  . HERNIA REPAIR    . KNEE RECONSTRUCTION         Home Medications    Prior to Admission medications   Medication Sig Start Date End Date Taking? Authorizing Provider  amoxicillin (AMOXIL) 875 MG tablet Take 1 tablet (875 mg total) by mouth 2 (two) times daily. 07/17/16   Lattie HawBeese, Sabastien Tyler A, MD  predniSONE (DELTASONE) 20 MG tablet Take one tab by mouth twice daily for 5 days, then one daily. Take with food. 07/17/16   Lattie HawBeese, Bayley Hurn A, MD    Family History Family History  Problem Relation Age of Onset  . Heart disease Mother     Social History Social History  Substance Use Topics  . Smoking status: Former Games developermoker  . Smokeless tobacco: Never Used  . Alcohol use No     Allergies   Patient has no known allergies.   Review of Systems Review of Systems + sore throat ? cough No pleuritic pain No wheezing + nasal congestion + post-nasal drainage No sinus pain/pressure No itchy/red eyes ? earache No hemoptysis No SOB No fever, + chills No nausea No vomiting No abdominal pain No diarrhea No urinary symptoms No skin rash + fatigue No myalgias No headache Used OTC meds without relief   Physical Exam Triage Vital Signs ED Triage Vitals  Enc Vitals Group     BP 07/17/16 1936 129/77     Pulse Rate 07/17/16 1936 74     Resp --      Temp 07/17/16 1936 98 F (36.7 C)     Temp Source 07/17/16 1936 Oral     SpO2 07/17/16 1936 96 %     Weight 07/17/16 1937 210 lb  (95.3 kg)     Height --      Head Circumference --      Peak Flow --      Pain Score 07/17/16 1937 0     Pain Loc --      Pain Edu? --      Excl. in GC? --    No data found.   Updated Vital Signs BP 129/77 (BP Location: Right Arm)   Pulse 74   Temp 98 F (36.7 C) (Oral)   Wt 210 lb (95.3 kg)   SpO2 96%   BMI 28.48 kg/m   Visual Acuity Right Eye Distance:   Left Eye Distance:   Bilateral Distance:    Right Eye Near:   Left Eye Near:    Bilateral Near:     Physical Exam Nursing notes and Vital Signs reviewed. Appearance:  Patient appears stated age, and in no acute distress Eyes:  Pupils are equal, round, and reactive to light and accomodation.  Extraocular movement is intact.  Conjunctivae are not inflamed  Ears:  Canals normal.  Left tympanic membrane scarred; right tympanic membrane normal. Nose:  Congested turbinates, most prominent on the left.  No sinus  tenderness.  Pharynx:  Erythematous Neck:  Supple.  Enlarged posterior/lateral nodes are palpated bilaterally, tender to palpation on the left.   Lungs:   Rhonchi left chest.  Breath sounds are equal.  Moving air well. Heart:  Regular rate and rhythm without murmurs, rubs, or gallops.  Abdomen:  Nontender without masses or hepatosplenomegaly.  Bowel sounds are present.  No CVA or flank tenderness.  Extremities:  No edema.  Skin:  No rash present.    UC Treatments / Results  Labs (all labs ordered are listed, but only abnormal results are displayed) Labs Reviewed  POCT RAPID STREP A (OFFICE) negative    EKG  EKG Interpretation None       Radiology No results found.  Procedures Procedures (including critical care time)  Medications Ordered in UC Medications - No data to display   Initial Impression / Assessment and Plan / UC Course  I have reviewed the triage vital signs and the nursing notes.  Pertinent labs & imaging results that were available during my care of the patient were reviewed by  me and considered in my medical decision making (see chart for details).    Suspect early viral URI Begin empiric amoxicillin 875mg  BID, and prednisone burst/taper. If increasing cold-like symptoms develop, try the following: Take plain guaifenesin (1200mg  extended release tabs such as Mucinex) twice daily, with plenty of water, for cough and congestion.  May add Pseudoephedrine (30mg , one or two every 4 to 6 hours) for sinus congestion.  Get adequate rest.   May use Afrin nasal spray (or generic oxymetazoline) each morning for about 5 days and then discontinue.  Also recommend using saline nasal spray several times daily and saline nasal irrigation (AYR is a common brand).  Use Flonase nasal spray each morning after using Afrin nasal spray and saline nasal irrigation. Try warm salt water gargles for sore throat.  Stop all antihistamines for now, and other non-prescription cough/cold preparations. May take Delsym Cough Suppressant at bedtime for nighttime cough.  Follow-up with family doctor if not improving about10 days.     Final Clinical Impressions(s) / UC Diagnoses   Final diagnoses:  Pharyngitis, unspecified etiology    New Prescriptions Current Discharge Medication List       Lattie Haw, MD 07/21/16 2200

## 2016-07-23 ENCOUNTER — Telehealth: Payer: Self-pay

## 2016-07-23 DIAGNOSIS — E7800 Pure hypercholesterolemia, unspecified: Secondary | ICD-10-CM | POA: Diagnosis not present

## 2016-07-23 DIAGNOSIS — Z0000 Encounter for general adult medical examination without abnormal findings: Secondary | ICD-10-CM | POA: Diagnosis not present

## 2016-07-23 NOTE — Telephone Encounter (Signed)
Encounter opened for POCT strep order

## 2016-08-21 ENCOUNTER — Emergency Department
Admission: EM | Admit: 2016-08-21 | Discharge: 2016-08-21 | Disposition: A | Discharge status: Home / Self Care | Payer: BLUE CROSS/BLUE SHIELD | Source: Home / Self Care | Attending: Family Medicine | Admitting: Family Medicine

## 2016-08-21 ENCOUNTER — Encounter: Payer: Self-pay | Admitting: Emergency Medicine

## 2016-08-21 ENCOUNTER — Emergency Department (INDEPENDENT_AMBULATORY_CARE_PROVIDER_SITE_OTHER): Payer: BLUE CROSS/BLUE SHIELD

## 2016-08-21 DIAGNOSIS — M25561 Pain in right knee: Secondary | ICD-10-CM

## 2016-08-21 DIAGNOSIS — M25461 Effusion, right knee: Secondary | ICD-10-CM

## 2016-08-21 DIAGNOSIS — M1711 Unilateral primary osteoarthritis, right knee: Secondary | ICD-10-CM | POA: Diagnosis not present

## 2016-08-21 MED ORDER — HYDROCODONE-ACETAMINOPHEN 5-325 MG PO TABS: 1.0000 | ORAL_TABLET | Freq: Four times a day (QID) | ORAL | 0 refills | Status: DC | PRN

## 2016-08-21 MED ORDER — CEPHALEXIN 500 MG PO CAPS: 500.0000 mg | ORAL_CAPSULE | Freq: Three times a day (TID) | ORAL | 0 refills | Status: DC

## 2016-08-21 NOTE — Discharge Instructions (Signed)
°  Norco/Vicodin (hydrocodone-acetaminophen) is a narcotic pain medication, do not combine these medications with others containing tylenol. While taking, do not drink alcohol, drive, or perform any other activities that requires focus while taking these medications.  ° °

## 2016-08-21 NOTE — ED Provider Notes (Signed)
CSN: [id number]     Arrival date & time 08/21/16  1817 History   First MD Initiated Contact with Patient 08/21/16 1855     Chief Complaint  Patient presents with  . Knee Pain   (Consider location/radiation/quality/duration/timing/severity/associated sxs/prior Treatment) HPI  Warren Danesaul Ingman is a 46 y.o. male presenting to UC with c/o sudden onset Right knee pain, swelling, and warmth that started yesterday, gradually worsening.  Pt notes he was doing a few exercises recently that included kneeling planks but denies having pain at that time.  Pain is aching and throbbing, worse with movement and palpation, 7/10.  He has taken ibuprofen with minimal relief.  Denies hx of gout.  Hx of Left knee surgery in the 1990s due to an injury.    Past Medical History:  Diagnosis Date  . Hyperlipidemia    Past Surgical History:  Procedure Laterality Date  . HERNIA REPAIR    . KNEE RECONSTRUCTION     Family History  Problem Relation Age of Onset  . Heart disease Mother    Social History  Substance Use Topics  . Smoking status: Former Games developermoker  . Smokeless tobacco: Never Used  . Alcohol use No    Review of Systems  Constitutional: Negative for chills and fever.  Musculoskeletal: Positive for arthralgias, gait problem, joint swelling and myalgias.  Skin: Positive for color change. Negative for rash and wound.  Neurological: Negative for weakness and numbness.    Allergies  Patient has no known allergies.  Home Medications   Prior to Admission medications   Medication Sig Start Date End Date Taking? Authorizing Provider  amoxicillin (AMOXIL) 875 MG tablet Take 1 tablet (875 mg total) by mouth 2 (two) times daily. 07/17/16   Lattie HawBeese, Stephen A, MD  cephALEXin (KEFLEX) 500 MG capsule Take 1 capsule (500 mg total) by mouth 3 (three) times daily. 08/21/16   Lurene ShadowPhelps, Nakshatra Klose O, PA-C  HYDROcodone-acetaminophen (NORCO/VICODIN) 5-325 MG tablet Take 1 tablet by mouth every 6 (six) hours as needed for moderate  pain or severe pain. 08/21/16   Lurene ShadowPhelps, Rosalva Neary O, PA-C  predniSONE (DELTASONE) 20 MG tablet Take one tab by mouth twice daily for 5 days, then one daily. Take with food. 07/17/16   Lattie HawBeese, Stephen A, MD   Meds Ordered and Administered this Visit  Medications - No data to display  BP 115/75 (BP Location: Right Arm)   Pulse 68   Temp 98.9 F (37.2 C) (Oral)   Wt 217 lb (98.4 kg)   SpO2 96%   BMI 29.43 kg/m  No data found.   Physical Exam  Constitutional: He is oriented to person, place, and time. He appears well-developed and well-nourished. No distress.  HENT:  Head: Normocephalic and atraumatic.  Eyes: EOM are normal.  Neck: Normal range of motion.  Cardiovascular: Normal rate.   Pulmonary/Chest: Effort normal.  Musculoskeletal: He exhibits edema and tenderness.  Right knee: moderate edema, tenderness to anterior knee. Slight decreased knee flexion. Calf is soft, non-tender.  Neurological: He is alert and oriented to person, place, and time.  Skin: Skin is warm and dry. He is not diaphoretic.  Right knee: 2mm superficial abrasion. Diffuse anterior knee erythema and warmth. Moderate edema. Tender.   Psychiatric: He has a normal mood and affect. His behavior is normal.  Nursing note and vitals reviewed.   Urgent Care Course     Procedures (including critical care time)  Labs Review Labs Reviewed - No data to display  Imaging Review Dg Knee  Complete 4 Views Right  Result Date: 08/21/2016 CLINICAL DATA:  Right knee pain for 2 days. Swelling. No known injury. EXAM: RIGHT KNEE - COMPLETE 4+ VIEW COMPARISON:  None. FINDINGS: No evidence of fracture, dislocation, or joint effusion. Mild medial tibiofemoral joint space loss and peripheral spurring. Minimal patellofemoral spurring laterally. There is moderate anterior prepatellar soft tissue thickening at edema. No soft tissue air or radiopaque foreign body. IMPRESSION: 1. Mild osteoarthritis.  No acute osseous abnormality. 2. Moderate  prepatellar soft tissue thickening and edema, can be seen with prepatellar bursitis. Cellulitis could have a similar appearance in the appropriate clinical setting. Electronically Signed   By: Rubye Oaks M.D.   On: 08/21/2016 19:28       MDM   1. Pain and swelling of right knee    Pt c/o pain and swelling of Right knee w/o known injury. No hx of gout.  He does have erythema, warmth, and edema to knee. X-ray questionable for early cellulitis. Pt does have small superficial abrasion. Will cover for potential cellulitis.  Rx: Keflex and norco Knee sleeve applied for comfort.   Encouraged f/u with Sports Medicine later this week for further evaluation and treatment, especially if not improving.     Lurene Shadow, [place] 08/22/16 [phone]

## 2016-08-21 NOTE — ED Triage Notes (Signed)
Pt c/o right knee pain, swelling and warmth that started yesterday. Denies previous injury or hx of gout.

## 2016-08-23 ENCOUNTER — Encounter: Payer: Self-pay | Admitting: Emergency Medicine

## 2016-08-23 ENCOUNTER — Emergency Department
Admission: EM | Admit: 2016-08-23 | Discharge: 2016-08-23 | Disposition: A | Discharge status: Home / Self Care | Payer: BLUE CROSS/BLUE SHIELD | Source: Home / Self Care | Attending: Family Medicine | Admitting: Family Medicine

## 2016-08-23 DIAGNOSIS — L03115 Cellulitis of right lower limb: Secondary | ICD-10-CM

## 2016-08-23 MED ORDER — CLINDAMYCIN HCL 300 MG PO CAPS: 300.0000 mg | ORAL_CAPSULE | Freq: Three times a day (TID) | ORAL | 0 refills | Status: AC

## 2016-08-23 NOTE — ED Triage Notes (Signed)
Pt c/o right knee swelling, warmth and redness that has worsened since 7/24 visit. Taking abx but redness has spread down entire leg.

## 2016-08-23 NOTE — Discharge Instructions (Signed)
May add Ibuprofen 200mg , 4 tabs every 8 hours with food.  Stop Keflex. If symptoms become significantly worse during the night or over the weekend, proceed to the local emergency room.

## 2016-08-23 NOTE — ED Provider Notes (Signed)
[facility]    CSN: [id number] Arrival date & time: 08/23/16  1939     History   Chief Complaint Chief Complaint  Patient presents with  . Joint Swelling    HPI Brett Wall is a 46 y.o. male.   Patient was treated two days ago for cellulitis of his right knee.  Despite taking Keflex, he reports increased warmth, swelling, and redness migrating below his knee.  No fevers, chills, and sweats.   The history is provided by the patient.    Past Medical History:  Diagnosis Date  . Hyperlipidemia     There are no active problems to display for this patient.   Past Surgical History:  Procedure Laterality Date  . HERNIA REPAIR    . KNEE RECONSTRUCTION         Home Medications    Prior to Admission medications   Medication Sig Start Date End Date Taking? Authorizing Provider  cephALEXin (KEFLEX) 500 MG capsule Take 1 capsule (500 mg total) by mouth 3 (three) times daily. 08/21/16   Lurene ShadowPhelps, Erin O, PA-C  clindamycin (CLEOCIN) 300 MG capsule Take 1 capsule (300 mg total) by mouth 3 (three) times daily. (every 8 hours) 08/23/16 09/02/16  Lattie HawBeese, Stephen A, MD  HYDROcodone-acetaminophen (NORCO/VICODIN) 5-325 MG tablet Take 1 tablet by mouth every 6 (six) hours as needed for moderate pain or severe pain. 08/21/16   Lurene ShadowPhelps, Erin O, PA-C  predniSONE (DELTASONE) 20 MG tablet Take one tab by mouth twice daily for 5 days, then one daily. Take with food. 07/17/16   Lattie HawBeese, Stephen A, MD    Family History Family History  Problem Relation Age of Onset  . Heart disease Mother     Social History Social History  Substance Use Topics  . Smoking status: Former Games developermoker  . Smokeless tobacco: Never Used  . Alcohol use No     Allergies   Patient has no known allergies.   Review of Systems Review of Systems  Constitutional: Negative for chills, diaphoresis, fatigue and fever.  HENT: Negative.   Eyes: Negative.   Respiratory: Negative.   Cardiovascular: Negative.     Gastrointestinal: Negative.   Genitourinary: Negative.   Musculoskeletal: Positive for joint swelling.  Skin: Positive for color change.  Neurological: Negative.      Physical Exam Triage Vital Signs ED Triage Vitals  Enc Vitals Group     BP 08/23/16 2001 129/71     Pulse Rate 08/23/16 2001 76     Resp --      Temp 08/23/16 2001 98.2 F (36.8 C)     Temp Source 08/23/16 2001 Oral     SpO2 08/23/16 2001 98 %     Weight --      Height --      Head Circumference --      Peak Flow --      Pain Score 08/23/16 2002 4     Pain Loc --      Pain Edu? --      Excl. in GC? --    No data found.   Updated Vital Signs BP 129/71 (BP Location: Right Arm)   Pulse 76   Temp 98.2 F (36.8 C) (Oral)   SpO2 98%   Visual Acuity Right Eye Distance:   Left Eye Distance:   Bilateral Distance:    Right Eye Near:   Left Eye Near:    Bilateral Near:     Physical Exam  Constitutional: He appears well-developed  and well-nourished. No distress.  HENT:  Head: Normocephalic.  Eyes: Pupils are equal, round, and reactive to light.  Neck: Normal range of motion.  Cardiovascular: Normal rate.   Pulmonary/Chest: Effort normal.  Musculoskeletal:       Right knee: He exhibits swelling and erythema. He exhibits normal range of motion. Tenderness found.       Legs: Right knee reveals mild swelling, erythema, and warmth over the pre-patellar bursa without fluctuance.  The erythema and warmth extend distally below the knee to mid pre-patellar area.  No calf tenderness.  Skin: Skin is warm.  Nursing note and vitals reviewed.    UC Treatments / Results  Labs (all labs ordered are listed, but only abnormal results are displayed) Labs Reviewed - No data to display  EKG  EKG Interpretation None       Radiology No results found.  Procedures Procedures (including critical care time)  Medications Ordered in UC Medications - No data to display   Initial Impression / Assessment and  Plan / UC Course  I have reviewed the triage vital signs and the nursing notes.  Pertinent labs & imaging results that were available during my care of the patient were reviewed by me and considered in my medical decision making (see chart for details).    Begin Clindamycin 300mg  Q8hr.  Stop Keflex.. May add Ibuprofen 200mg , 4 tabs every 8 hours with food.  If symptoms become significantly worse during the night or over the weekend, proceed to the local emergency room.  Followup with Dr. Rodney Langtonhomas Thekkekandam or Dr. Clementeen GrahamEvan Corey.    Final Clinical Impressions(s) / UC Diagnoses   Final diagnoses:  Cellulitis of leg, right    New Prescriptions New Prescriptions   CLINDAMYCIN (CLEOCIN) 300 MG CAPSULE    Take 1 capsule (300 mg total) by mouth 3 (three) times daily. (every 8 hours)     Lattie HawBeese, Stephen A, MD 08/31/16 [phone]

## 2016-08-24 DIAGNOSIS — L03115 Cellulitis of right lower limb: Secondary | ICD-10-CM | POA: Diagnosis not present

## 2017-03-13 ENCOUNTER — Encounter: Payer: Self-pay | Admitting: *Deleted

## 2017-03-13 ENCOUNTER — Emergency Department
Admission: EM | Admit: 2017-03-13 | Discharge: 2017-03-13 | Disposition: A | Discharge status: Home / Self Care | Payer: BLUE CROSS/BLUE SHIELD | Source: Home / Self Care | Attending: Family Medicine | Admitting: Family Medicine

## 2017-03-13 ENCOUNTER — Other Ambulatory Visit: Payer: Self-pay

## 2017-03-13 DIAGNOSIS — J029 Acute pharyngitis, unspecified: Secondary | ICD-10-CM | POA: Diagnosis not present

## 2017-03-13 LAB — POCT RAPID STREP A (OFFICE): Rapid Strep A Screen: NEGATIVE

## 2017-03-13 NOTE — ED Provider Notes (Signed)
[facility]    CSN: [id number] Arrival date & time: 03/13/17  0913     History   Chief Complaint Chief Complaint  Patient presents with  . Sore Throat    HPI Warren Danesaul Pasquarello is a 47 y.o. male.   HPI Warren Danesaul Delapena is a 47 y.o. male presenting to UC with c/o sore throat that started last night. Pain is aching and scratchy, 4/10. Hx of strep throat about 1 year ago. Pain feels similar.  Denies fever, chills, n/v/d. No sick contacts. He has not tried anything for his pain.  He has been able to keep down fluids.    Past Medical History:  Diagnosis Date  . Hyperlipidemia     There are no active problems to display for this patient.   Past Surgical History:  Procedure Laterality Date  . HERNIA REPAIR    . KNEE RECONSTRUCTION         Home Medications    Prior to Admission medications   Not on File    Family History Family History  Problem Relation Age of Onset  . Heart disease Mother     Social History Social History   Tobacco Use  . Smoking status: Former Games developermoker  . Smokeless tobacco: Never Used  Substance Use Topics  . Alcohol use: No  . Drug use: No     Allergies   Patient has no known allergies.   Review of Systems Review of Systems  Constitutional: Negative for chills and fever.  HENT: Positive for sore throat. Negative for congestion, ear pain, trouble swallowing and voice change.   Respiratory: Negative for cough and shortness of breath.   Cardiovascular: Negative for chest pain and palpitations.  Gastrointestinal: Negative for abdominal pain, diarrhea, nausea and vomiting.  Musculoskeletal: Negative for arthralgias, back pain and myalgias.  Skin: Negative for rash.  Neurological: Positive for headaches ( mild). Negative for dizziness and light-headedness.     Physical Exam Triage Vital Signs ED Triage Vitals  Enc Vitals Group     BP 03/13/17 0954 131/77     Pulse Rate 03/13/17 0954 67     Resp 03/13/17 0954 16     Temp  03/13/17 0954 97.9 F (36.6 C)     Temp Source 03/13/17 0954 Oral     SpO2 03/13/17 0954 97 %     Weight 03/13/17 0955 216 lb (98 kg)     Height --      Head Circumference --      Peak Flow --      Pain Score 03/13/17 0955 0     Pain Loc --      Pain Edu? --      Excl. in GC? --    No data found.  Updated Vital Signs BP 131/77 (BP Location: Right Arm)   Pulse 67   Temp 97.9 F (36.6 C) (Oral)   Resp 16   Wt 216 lb (98 kg)   SpO2 97%   BMI 29.29 kg/m   Visual Acuity Right Eye Distance:   Left Eye Distance:   Bilateral Distance:    Right Eye Near:   Left Eye Near:    Bilateral Near:     Physical Exam  Constitutional: He is oriented to person, place, and time. He appears well-developed and well-nourished.  Non-toxic appearance. He does not appear ill. No distress.  HENT:  Head: Normocephalic and atraumatic.  Right Ear: Tympanic membrane normal.  Left Ear: Tympanic membrane normal.  Nose: Nose  normal. Right sinus exhibits no maxillary sinus tenderness and no frontal sinus tenderness. Left sinus exhibits no maxillary sinus tenderness and no frontal sinus tenderness.  Mouth/Throat: Uvula is midline and mucous membranes are normal. Posterior oropharyngeal erythema present. No oropharyngeal exudate, posterior oropharyngeal edema or tonsillar abscesses.  Eyes: EOM are normal.  Neck: Normal range of motion.  Cardiovascular: Normal rate.  Pulmonary/Chest: Effort normal.  Musculoskeletal: Normal range of motion.  Neurological: He is alert and oriented to person, place, and time.  Skin: Skin is warm and dry.  Psychiatric: He has a normal mood and affect. His behavior is normal.  Nursing note and vitals reviewed.    UC Treatments / Results  Labs (all labs ordered are listed, but only abnormal results are displayed) Labs Reviewed  STREP A DNA PROBE  POCT RAPID STREP A (OFFICE)    EKG  EKG Interpretation None       Radiology No results  found.  Procedures Procedures (including critical care time)  Medications Ordered in UC Medications - No data to display   Initial Impression / Assessment and Plan / UC Course  I have reviewed the triage vital signs and the nursing notes.  Pertinent labs & imaging results that were available during my care of the patient were reviewed by me and considered in my medical decision making (see chart for details).     Rapid strep: NEGATIVE Culture sent Home care instructions provided F/u with PCP in 1 week if not improving.   Final Clinical Impressions(s) / UC Diagnoses   Final diagnoses:  Acute pharyngitis, unspecified etiology    ED Discharge Orders    None       Controlled Substance Prescriptions [place] Controlled Substance Registry consulted? Not Applicable   Rolla Plate 03/13/17 1115

## 2017-03-13 NOTE — ED Triage Notes (Signed)
Pt c/o sore throat x last night. Denies fever. No OTC meds.

## 2017-03-13 NOTE — Discharge Instructions (Signed)
°  You may take 500mg acetaminophen every 4-6 hours or in combination with ibuprofen 400-600mg every 6-8 hours as needed for pain, inflammation, and fever. ° °Be sure to drink at least eight 8oz glasses of water to stay well hydrated and get at least 8 hours of sleep at night, preferably more while sick.  ° °

## 2017-03-14 ENCOUNTER — Telehealth: Payer: Self-pay | Admitting: Emergency Medicine

## 2017-03-14 LAB — STREP A DNA PROBE: Group A Strep Probe: NOT DETECTED

## 2017-03-14 NOTE — Telephone Encounter (Signed)
Spoke with patient and told him Strep dna negative; he said he is feeling a little bit better.

## 2017-07-22 DIAGNOSIS — M25561 Pain in right knee: Secondary | ICD-10-CM | POA: Diagnosis not present

## 2017-07-24 DIAGNOSIS — Z0000 Encounter for general adult medical examination without abnormal findings: Secondary | ICD-10-CM | POA: Diagnosis not present

## 2017-07-24 DIAGNOSIS — E7800 Pure hypercholesterolemia, unspecified: Secondary | ICD-10-CM | POA: Diagnosis not present

## 2017-09-23 ENCOUNTER — Other Ambulatory Visit: Payer: Self-pay | Admitting: Family Medicine

## 2017-09-23 ENCOUNTER — Ambulatory Visit
Admission: RE | Admit: 2017-09-23 | Discharge: 2017-09-23 | Disposition: A | Discharge status: Home / Self Care | Payer: BLUE CROSS/BLUE SHIELD | Source: Ambulatory Visit | Attending: Family Medicine | Admitting: Family Medicine

## 2017-09-23 DIAGNOSIS — M79671 Pain in right foot: Secondary | ICD-10-CM

## 2018-01-13 ENCOUNTER — Emergency Department
Admission: EM | Admit: 2018-01-13 | Discharge: 2018-01-13 | Disposition: A | Discharge status: Home / Self Care | Payer: BLUE CROSS/BLUE SHIELD | Source: Home / Self Care

## 2018-01-13 ENCOUNTER — Encounter: Payer: Self-pay | Admitting: *Deleted

## 2018-01-13 ENCOUNTER — Other Ambulatory Visit: Payer: Self-pay

## 2018-01-13 DIAGNOSIS — K602 Anal fissure, unspecified: Secondary | ICD-10-CM

## 2018-01-13 MED ORDER — HYDROCODONE-ACETAMINOPHEN 5-325 MG PO TABS: 1.0000 | ORAL_TABLET | Freq: Four times a day (QID) | ORAL | 0 refills | Status: DC | PRN

## 2018-01-13 MED ORDER — DOXYCYCLINE HYCLATE 100 MG PO TABS: 100.0000 mg | ORAL_TABLET | Freq: Two times a day (BID) | ORAL | 0 refills | Status: DC

## 2018-01-13 NOTE — ED Provider Notes (Signed)
[name]    CSN: [id number] Arrival date & time: 01/13/18  1941     History   Chief Complaint Chief Complaint  Patient presents with  . Hemorrhoids    HPI Ziyan Schoon is a 47 y.o. male.   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  He has had the same problem once before.  He has been soaking the area today in Epsom salts but has not seen improvement.  He says the pain is 8 out of 10 and he does feel some swelling.  It is located on the right side of his anus and it is uncomfortable to sit.  He has had some chills but no fever.     Past Medical History:  Diagnosis Date  . Hyperlipidemia     Patient Active Problem List   Diagnosis Date Noted  . Left knee pain 03/04/2014    Past Surgical History:  Procedure Laterality Date  . HERNIA REPAIR    . KNEE RECONSTRUCTION         Home Medications    Prior to Admission medications   Medication Sig Start Date End Date Taking? Authorizing Provider  doxycycline (VIBRA-TABS) 100 MG tablet Take 1 tablet (100 mg total) by mouth 2 (two) times daily. 01/13/18   Elvina Sidle, MD  HYDROcodone-acetaminophen (NORCO) 5-325 MG tablet Take 1 tablet by mouth every 6 (six) hours as needed for moderate pain. 01/13/18   Elvina Sidle, MD    Family History Family History  Problem Relation Age of Onset  . Heart disease Mother     Social History Social History   Tobacco Use  . Smoking status: Former Games developer  . Smokeless tobacco: Never Used  Substance Use Topics  . Alcohol use: No  . Drug use: No     Allergies   Patient has no known allergies.   Review of Systems Review of Systems   Physical Exam Triage Vital Signs ED Triage Vitals  Enc Vitals Group     BP 01/13/18 1953 (!) 150/81     Pulse Rate 01/13/18 1953 99     Resp 01/13/18 1953 18     Temp 01/13/18 1953 99.4 F (37.4 C)     Temp Source 01/13/18 1953 Oral     SpO2 01/13/18 1953 96 %   Weight 01/13/18 1954 216 lb (98 kg)     Height 01/13/18 1954 6' (1.829 m)     Head Circumference --      Peak Flow --      Pain Score 01/13/18 1954 8     Pain Loc --      Pain Edu? --      Excl. in GC? --    No data found.  Updated Vital Signs BP (!) 150/81 (BP Location: Right Arm)   Pulse 99   Temp 99.4 F (37.4 C) (Oral)   Resp 18   Ht 6' (1.829 m)   Wt 98 kg   SpO2 96%   BMI 29.29 kg/m    Physical Exam Vitals signs and nursing note reviewed.  Constitutional:      Appearance: Normal appearance.     Comments: Patient is unable to sit normally and has to lean on his right side to avoid putting pressure on the affected area.  HENT:     Head: Normocephalic.     Mouth/Throat:     Mouth: Mucous membranes are moist.  Eyes:  Conjunctiva/sclera: Conjunctivae normal.  Neck:     Musculoskeletal: Normal range of motion and neck supple.  Pulmonary:     Effort: Pulmonary effort is normal.  Genitourinary:    Comments: Perirectal area shows about 1/2 cm of swelling and it is tender to palpate with minimal fluctuance.  There is some mild surrounding erythema. Musculoskeletal: Normal range of motion.  Skin:    General: Skin is warm and dry.  Neurological:     General: No focal deficit present.     Mental Status: He is alert.      UC Treatments / Results  Labs (all labs ordered are listed, but only abnormal results are displayed) Labs Reviewed - No data to display  EKG None  Radiology No results found.  Procedures Procedures (including critical care time)  Medications Ordered in UC Medications - No data to display  Initial Impression / Assessment and Plan / UC Course  I have reviewed the triage vital signs and the nursing notes.  Pertinent labs & imaging results that were available during my care of the patient were reviewed by me and considered in my medical decision making (see chart for details).    Final Clinical Impressions(s) / UC Diagnoses   Final  diagnoses:  Fissure, anal     Discharge Instructions     Keep your appointment tomorrow with Dr. Clovis RileyMitchell    ED Prescriptions    Medication Sig Dispense Auth. Provider   HYDROcodone-acetaminophen (NORCO) 5-325 MG tablet Take 1 tablet by mouth every 6 (six) hours as needed for moderate pain. 12 tablet Elvina SidleLauenstein, Margarite Vessel, MD   doxycycline (VIBRA-TABS) 100 MG tablet  (Status: Discontinued) Take 1 tablet (100 mg total) by mouth 2 (two) times daily. 20 tablet Elvina SidleLauenstein, Preesha Benjamin, MD   doxycycline (VIBRA-TABS) 100 MG tablet Take 1 tablet (100 mg total) by mouth 2 (two) times daily. 20 tablet Elvina SidleLauenstein, Deklyn Gibbon, MD     Controlled Substance Prescriptions [place] Controlled Substance Registry consulted? Not Applicable   Elvina SidleLauenstein, Lowen Mansouri, MD 01/13/18 2004

## 2018-01-13 NOTE — ED Triage Notes (Signed)
Pt c/o painful hemorrhoids x 2 days. He has used prep H wipes and suppository.

## 2018-01-13 NOTE — Discharge Instructions (Addendum)
Keep your appointment tomorrow with Dr. Clovis RileyMitchell

## 2018-01-14 DIAGNOSIS — L0293 Carbuncle, unspecified: Secondary | ICD-10-CM | POA: Diagnosis not present

## 2018-04-07 DIAGNOSIS — M79671 Pain in right foot: Secondary | ICD-10-CM | POA: Diagnosis not present

## 2018-04-09 ENCOUNTER — Other Ambulatory Visit: Payer: Self-pay

## 2018-04-09 ENCOUNTER — Ambulatory Visit (INDEPENDENT_AMBULATORY_CARE_PROVIDER_SITE_OTHER): Payer: BLUE CROSS/BLUE SHIELD

## 2018-04-09 ENCOUNTER — Encounter: Payer: Self-pay | Admitting: Podiatry

## 2018-04-09 ENCOUNTER — Ambulatory Visit (INDEPENDENT_AMBULATORY_CARE_PROVIDER_SITE_OTHER): Payer: BLUE CROSS/BLUE SHIELD | Admitting: Podiatry

## 2018-04-09 ENCOUNTER — Other Ambulatory Visit: Payer: Self-pay | Admitting: Podiatry

## 2018-04-09 VITALS — BP 93/50

## 2018-04-09 DIAGNOSIS — B351 Tinea unguium: Secondary | ICD-10-CM

## 2018-04-09 DIAGNOSIS — M79671 Pain in right foot: Secondary | ICD-10-CM

## 2018-04-09 DIAGNOSIS — M779 Enthesopathy, unspecified: Secondary | ICD-10-CM

## 2018-04-09 MED ORDER — TRIAMCINOLONE ACETONIDE 10 MG/ML IJ SUSP
10.0000 mg | Freq: Once | INTRAMUSCULAR | Status: AC
  Administered 2018-04-09: 10 mg

## 2018-04-09 NOTE — Progress Notes (Signed)
Subjective:   Patient ID: Brett Wall, male   DOB: 48 y.o.   MRN: [id number]   HPI Patient presents stating that he is had a lot of pain on the right foot that is been going on for a few weeks and making it gradually harder for him to walk.  Does not remember specific injury and is worse at night also is concerned about his big toenail right which is been thick and damaged been present for a long time along with other nails that are a problem for him at times.  Patient does not smoke likes to be active   Review of Systems  All other systems reviewed and are negative.       Objective:  Physical Exam Vitals signs and nursing note reviewed.  Constitutional:      Appearance: He is well-developed.  Pulmonary:     Effort: Pulmonary effort is normal.  Musculoskeletal: Normal range of motion.  Skin:    General: Skin is warm.  Neurological:     Mental Status: He is alert.     Neurovascular status intact muscle strength is adequate range of motion within normal limits with patient noted to have exquisite discomfort in the sinus tarsi right with inflammation fluid of the joint and is noted to have severe nail disease hallux right and several other nails especially the hallux left at this time.  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  Discussed nails and I do not recommend oral treatment due to the damage and severity of the condition but I do think ultimately nail removal may be necessary and I educated him on this  X-ray indicates no signs of arthritis or stress fracture environment

## 2018-07-29 DIAGNOSIS — Z0000 Encounter for general adult medical examination without abnormal findings: Secondary | ICD-10-CM | POA: Diagnosis not present

## 2018-08-26 DIAGNOSIS — E7800 Pure hypercholesterolemia, unspecified: Secondary | ICD-10-CM | POA: Diagnosis not present

## 2018-08-26 DIAGNOSIS — Z5181 Encounter for therapeutic drug level monitoring: Secondary | ICD-10-CM | POA: Diagnosis not present

## 2019-06-05 DIAGNOSIS — G4731 Primary central sleep apnea: Secondary | ICD-10-CM | POA: Diagnosis not present

## 2019-07-30 ENCOUNTER — Other Ambulatory Visit: Payer: Self-pay | Admitting: Family Medicine

## 2019-07-30 DIAGNOSIS — N5089 Other specified disorders of the male genital organs: Secondary | ICD-10-CM

## 2019-07-30 DIAGNOSIS — Z0000 Encounter for general adult medical examination without abnormal findings: Secondary | ICD-10-CM | POA: Diagnosis not present

## 2019-07-30 DIAGNOSIS — Z125 Encounter for screening for malignant neoplasm of prostate: Secondary | ICD-10-CM | POA: Diagnosis not present

## 2019-07-30 DIAGNOSIS — E7800 Pure hypercholesterolemia, unspecified: Secondary | ICD-10-CM | POA: Diagnosis not present

## 2019-08-12 ENCOUNTER — Ambulatory Visit
Admission: RE | Admit: 2019-08-12 | Discharge: 2019-08-12 | Disposition: A | Discharge status: Home / Self Care | Payer: BLUE CROSS/BLUE SHIELD | Source: Ambulatory Visit | Attending: Family Medicine | Admitting: Family Medicine

## 2019-08-12 DIAGNOSIS — N433 Hydrocele, unspecified: Secondary | ICD-10-CM | POA: Diagnosis not present

## 2019-08-12 DIAGNOSIS — N503 Cyst of epididymis: Secondary | ICD-10-CM | POA: Diagnosis not present

## 2019-08-12 DIAGNOSIS — N5089 Other specified disorders of the male genital organs: Secondary | ICD-10-CM

## 2019-08-12 DIAGNOSIS — N4341 Spermatocele of epididymis, single: Secondary | ICD-10-CM | POA: Diagnosis not present

## 2020-08-15 DIAGNOSIS — E7800 Pure hypercholesterolemia, unspecified: Secondary | ICD-10-CM | POA: Diagnosis not present

## 2020-08-15 DIAGNOSIS — Z125 Encounter for screening for malignant neoplasm of prostate: Secondary | ICD-10-CM | POA: Diagnosis not present

## 2020-08-15 DIAGNOSIS — Z0000 Encounter for general adult medical examination without abnormal findings: Secondary | ICD-10-CM | POA: Diagnosis not present

## 2020-10-09 ENCOUNTER — Other Ambulatory Visit: Payer: Self-pay

## 2020-10-09 ENCOUNTER — Emergency Department
Admission: EM | Admit: 2020-10-09 | Discharge: 2020-10-09 | Disposition: A | Discharge status: Home / Self Care | Payer: BC Managed Care – PPO | Source: Home / Self Care

## 2020-10-09 ENCOUNTER — Encounter: Payer: Self-pay | Admitting: Emergency Medicine

## 2020-10-09 DIAGNOSIS — H00012 Hordeolum externum right lower eyelid: Secondary | ICD-10-CM | POA: Diagnosis not present

## 2020-10-09 DIAGNOSIS — H02842 Edema of right lower eyelid: Secondary | ICD-10-CM

## 2020-10-09 MED ORDER — CEFDINIR 300 MG PO CAPS: 300.0000 mg | ORAL_CAPSULE | Freq: Two times a day (BID) | ORAL | 0 refills | Status: AC

## 2020-10-09 MED ORDER — PREDNISONE 20 MG PO TABS: ORAL_TABLET | ORAL | 0 refills | Status: DC

## 2020-10-09 NOTE — ED Provider Notes (Signed)
[facility]    CSN: [id number] Arrival date & time: 10/09/20  1236      History   Chief Complaint Chief Complaint  Patient presents with   Eye Problem    HPI Brett Wall is a 50 y.o. male.   HPI 50 year old male presents with stye of right lower eyelid for 3 days.  Past Medical History:  Diagnosis Date   Hyperlipidemia     Patient Active Problem List   Diagnosis Date Noted   Left knee pain 03/04/2014    Past Surgical History:  Procedure Laterality Date   HERNIA REPAIR     KNEE RECONSTRUCTION         Home Medications    Prior to Admission medications   Medication Sig Start Date End Date Taking? Authorizing Provider  cefdinir (OMNICEF) 300 MG capsule Take 1 capsule (300 mg total) by mouth 2 (two) times daily for 7 days. 10/09/20 10/16/20 Yes Trevor Iha, FNP  predniSONE (DELTASONE) 20 MG tablet Take 3 tabs PO x 5 days. 10/09/20  Yes Trevor Iha, FNP  doxycycline (VIBRA-TABS) 100 MG tablet Take 1 tablet (100 mg total) by mouth 2 (two) times daily. Patient not taking: No sig reported 01/13/18   Elvina Sidle, MD  HYDROcodone-acetaminophen (NORCO) 5-325 MG tablet Take 1 tablet by mouth every 6 (six) hours as needed for moderate pain. 01/13/18   Elvina Sidle, MD    Family History Family History  Problem Relation Age of Onset   Heart disease Mother     Social History Social History   Tobacco Use   Smoking status: Former   Smokeless tobacco: Never  Building services engineer Use: Never used  Substance Use Topics   Alcohol use: No   Drug use: No     Allergies   Patient has no known allergies.   Review of Systems Review of Systems  Eyes:        Right lower eyelid redness and swelling x3 days  All other systems reviewed and are negative.   Physical Exam Triage Vital Signs ED Triage Vitals  Enc Vitals Group     BP 10/09/20 1309 126/84     Pulse Rate 10/09/20 1309 60     Resp 10/09/20 1309 16     Temp 10/09/20 1309 99.2 F  (37.3 C)     Temp Source 10/09/20 1309 Oral     SpO2 10/09/20 1309 97 %     Weight --      Height --      Head Circumference --      Peak Flow --      Pain Score 10/09/20 1308 1     Pain Loc --      Pain Edu? --      Excl. in GC? --    No data found.  Updated Vital Signs BP 126/84 (BP Location: Left Arm)   Pulse 60   Temp 99.2 F (37.3 C) (Oral)   Resp 16   SpO2 97%      Physical Exam Vitals and nursing note reviewed.  Constitutional:      General: He is not in acute distress.    Appearance: Normal appearance. He is normal weight. He is not ill-appearing.  HENT:     Head: Normocephalic and atraumatic.     Mouth/Throat:     Mouth: Mucous membranes are moist.     Pharynx: Oropharynx is clear.  Eyes:     Extraocular Movements: Extraocular movements intact.  Conjunctiva/sclera: Conjunctivae normal.     Pupils: Pupils are equal, round, and reactive to light.     Comments: Right lower eyelid: ~1.0 cm oval shaped erythematous abscess present inferior to lash line, moderate soft tissue swelling of lower eyelid noted  Cardiovascular:     Rate and Rhythm: Normal rate and regular rhythm.     Pulses: Normal pulses.     Heart sounds: Normal heart sounds.  Pulmonary:     Effort: Pulmonary effort is normal.     Breath sounds: Normal breath sounds.  Musculoskeletal:        General: Normal range of motion.     Cervical back: Normal range of motion and neck supple.  Skin:    General: Skin is warm and dry.  Neurological:     General: No focal deficit present.     Mental Status: He is alert and oriented to person, place, and time. Mental status is at baseline.  Psychiatric:        Mood and Affect: Mood normal.        Behavior: Behavior normal.        Thought Content: Thought content normal.     UC Treatments / Results  Labs (all labs ordered are listed, but only abnormal results are displayed) Labs Reviewed - No data to display  EKG   Radiology No results  found.  Procedures Procedures (including critical care time)  Medications Ordered in UC Medications - No data to display  Initial Impression / Assessment and Plan / UC Course  I have reviewed the triage vital signs and the nursing notes.  Pertinent labs & imaging results that were available during my care of the patient were reviewed by me and considered in my medical decision making (see chart for details).     MDM: 1.  Hordeolum externum of right lower eyelid-Rx'd cefdinir; 2.  Swelling of right lower eyelid-Rx'd Prednisone burst. Advised/instructed patient to take medication as directed with food to completion encourage patient increase daily water intake while taking these medications.  Discharged home, hemodynamically stable. Final Clinical Impressions(s) / UC Diagnoses   Final diagnoses:  Hordeolum externum of right lower eyelid  Swelling of right lower eyelid     Discharge Instructions      Advised/instructed patient to take medication as directed with food to completion encourage patient increase daily water intake while taking these medications.     ED Prescriptions     Medication Sig Dispense Auth. Provider   cefdinir (OMNICEF) 300 MG capsule Take 1 capsule (300 mg total) by mouth 2 (two) times daily for 7 days. 14 capsule Trevor Iha, FNP   predniSONE (DELTASONE) 20 MG tablet Take 3 tabs PO x 5 days. 15 tablet Trevor Iha, FNP      PDMP not reviewed this encounter.   Trevor Iha, FNP 10/09/20 1424

## 2020-10-09 NOTE — ED Triage Notes (Signed)
Patient presents to Urgent Care with complaints of cyst of lower right eye lid since 3 days ago. Patient reports redness, itching and some swelling. Denies any vision changes of the right eye. Has tried warm compresses last night.

## 2020-10-09 NOTE — Discharge Instructions (Addendum)
Advised/instructed patient to take medication as directed with food to completion encourage patient increase daily water intake while taking these medications.

## 2021-07-04 DIAGNOSIS — M79641 Pain in right hand: Secondary | ICD-10-CM | POA: Diagnosis not present

## 2021-07-04 DIAGNOSIS — M180 Bilateral primary osteoarthritis of first carpometacarpal joints: Secondary | ICD-10-CM | POA: Diagnosis not present

## 2021-07-04 DIAGNOSIS — M79642 Pain in left hand: Secondary | ICD-10-CM | POA: Diagnosis not present

## 2021-08-23 DIAGNOSIS — M79644 Pain in right finger(s): Secondary | ICD-10-CM | POA: Diagnosis not present

## 2021-08-23 DIAGNOSIS — Z125 Encounter for screening for malignant neoplasm of prostate: Secondary | ICD-10-CM | POA: Diagnosis not present

## 2021-08-23 DIAGNOSIS — E7800 Pure hypercholesterolemia, unspecified: Secondary | ICD-10-CM | POA: Diagnosis not present

## 2021-08-23 DIAGNOSIS — Z0000 Encounter for general adult medical examination without abnormal findings: Secondary | ICD-10-CM | POA: Diagnosis not present

## 2021-11-16 DIAGNOSIS — G4733 Obstructive sleep apnea (adult) (pediatric): Secondary | ICD-10-CM | POA: Diagnosis not present

## 2021-11-17 DIAGNOSIS — G4733 Obstructive sleep apnea (adult) (pediatric): Secondary | ICD-10-CM | POA: Diagnosis not present

## 2022-02-16 DIAGNOSIS — M25561 Pain in right knee: Secondary | ICD-10-CM | POA: Diagnosis not present

## 2022-02-16 DIAGNOSIS — E7800 Pure hypercholesterolemia, unspecified: Secondary | ICD-10-CM | POA: Diagnosis not present

## 2022-02-23 DIAGNOSIS — E7800 Pure hypercholesterolemia, unspecified: Secondary | ICD-10-CM | POA: Diagnosis not present

## 2022-03-19 DIAGNOSIS — R200 Anesthesia of skin: Secondary | ICD-10-CM | POA: Diagnosis not present

## 2022-03-19 DIAGNOSIS — E7800 Pure hypercholesterolemia, unspecified: Secondary | ICD-10-CM | POA: Diagnosis not present

## 2022-03-27 ENCOUNTER — Telehealth: Payer: Self-pay | Admitting: Emergency Medicine

## 2022-03-27 ENCOUNTER — Ambulatory Visit: Payer: Self-pay

## 2022-03-27 ENCOUNTER — Telehealth: Payer: Self-pay | Admitting: Family Medicine

## 2022-03-27 DIAGNOSIS — M5459 Other low back pain: Secondary | ICD-10-CM | POA: Diagnosis not present

## 2022-03-27 DIAGNOSIS — M1712 Unilateral primary osteoarthritis, left knee: Secondary | ICD-10-CM | POA: Diagnosis not present

## 2022-03-27 DIAGNOSIS — M5442 Lumbago with sciatica, left side: Secondary | ICD-10-CM | POA: Diagnosis not present

## 2022-03-27 DIAGNOSIS — M5441 Lumbago with sciatica, right side: Secondary | ICD-10-CM | POA: Diagnosis not present

## 2022-03-27 DIAGNOSIS — M25462 Effusion, left knee: Secondary | ICD-10-CM | POA: Diagnosis not present

## 2022-03-27 DIAGNOSIS — R200 Anesthesia of skin: Secondary | ICD-10-CM | POA: Diagnosis not present

## 2022-03-27 NOTE — Telephone Encounter (Signed)
Per pt's reason for visit, "Leg Pain - Numbness from hip down. Cannot feel when urinating or having a bowel movement. Finished round of prednisone with no change to symptoms. Reason for visit reviewed w/ Dr Meda Coffee, -er provider Nerik should go to ED ASAP for a higher level of care due to risk for paralysis. Pt may call 911 for transport. Pt verbalized an understanding

## 2022-03-27 NOTE — Telephone Encounter (Signed)
Reason for visit not appropriate for level of care at Urgent Care per Dr Meda Coffee. Call by this RN to direct pt to the closest ED ASAP. Pt can call 911 for transport per Dr Meda Coffee. Pt verbalized an understanding. Per pt, " Leg Pain - Numbness from hip down. Cannot feel when urinating or having a bowel movement. Finished round of prednisone with no change to symptoms"

## 2022-03-27 NOTE — Telephone Encounter (Signed)
The RN on duty today, Johnnye Sima, to my attention that this patient has an appointment scheduled this afternoon.  His chief complaint was numbness from the waist down, not being able to feel bowels and bladder.  This potentially could be a medical emergency such as cauda equina syndrome.  He was called and advised to go directly to the emergency room, call 911 if he does not have a ride.

## 2022-03-29 DIAGNOSIS — M1712 Unilateral primary osteoarthritis, left knee: Secondary | ICD-10-CM | POA: Diagnosis not present

## 2022-03-29 DIAGNOSIS — M5136 Other intervertebral disc degeneration, lumbar region: Secondary | ICD-10-CM | POA: Diagnosis not present

## 2022-03-29 DIAGNOSIS — R202 Paresthesia of skin: Secondary | ICD-10-CM | POA: Diagnosis not present

## 2022-03-29 DIAGNOSIS — M25561 Pain in right knee: Secondary | ICD-10-CM | POA: Diagnosis not present

## 2022-04-03 ENCOUNTER — Other Ambulatory Visit: Payer: Self-pay | Admitting: Sports Medicine

## 2022-04-03 DIAGNOSIS — R200 Anesthesia of skin: Secondary | ICD-10-CM

## 2022-04-04 ENCOUNTER — Ambulatory Visit (HOSPITAL_COMMUNITY)
Admission: RE | Admit: 2022-04-04 | Discharge: 2022-04-04 | Disposition: A | Discharge status: Home / Self Care | Payer: BC Managed Care – PPO | Source: Ambulatory Visit | Attending: Internal Medicine | Admitting: Internal Medicine

## 2022-04-04 ENCOUNTER — Other Ambulatory Visit (HOSPITAL_COMMUNITY): Payer: Self-pay | Admitting: Sports Medicine

## 2022-04-04 ENCOUNTER — Telehealth: Payer: Self-pay | Admitting: Internal Medicine

## 2022-04-04 DIAGNOSIS — M7989 Other specified soft tissue disorders: Secondary | ICD-10-CM | POA: Insufficient documentation

## 2022-04-04 DIAGNOSIS — M79605 Pain in left leg: Secondary | ICD-10-CM | POA: Insufficient documentation

## 2022-04-04 NOTE — Telephone Encounter (Signed)
[facility] is calling to get results given verbally. Caller gave Dr. Redgie Grayer cell. Requesting call.

## 2022-04-04 NOTE — Telephone Encounter (Signed)
Called provided number. Spoke to Dr. Sheppard Coil. He was made aware the results were not in yet. He verbalized understanding.

## 2022-04-09 DIAGNOSIS — M25562 Pain in left knee: Secondary | ICD-10-CM | POA: Diagnosis not present

## 2022-04-09 DIAGNOSIS — N5089 Other specified disorders of the male genital organs: Secondary | ICD-10-CM | POA: Diagnosis not present

## 2022-04-09 DIAGNOSIS — R202 Paresthesia of skin: Secondary | ICD-10-CM | POA: Diagnosis not present

## 2022-04-09 DIAGNOSIS — R200 Anesthesia of skin: Secondary | ICD-10-CM | POA: Diagnosis not present

## 2022-04-11 DIAGNOSIS — F40240 Claustrophobia: Secondary | ICD-10-CM | POA: Diagnosis not present

## 2022-04-11 DIAGNOSIS — M1732 Unilateral post-traumatic osteoarthritis, left knee: Secondary | ICD-10-CM | POA: Diagnosis not present

## 2022-04-11 DIAGNOSIS — M5416 Radiculopathy, lumbar region: Secondary | ICD-10-CM | POA: Diagnosis not present

## 2022-04-20 DIAGNOSIS — N503 Cyst of epididymis: Secondary | ICD-10-CM | POA: Diagnosis not present

## 2022-04-20 DIAGNOSIS — N433 Hydrocele, unspecified: Secondary | ICD-10-CM | POA: Diagnosis not present

## 2022-04-20 DIAGNOSIS — N5089 Other specified disorders of the male genital organs: Secondary | ICD-10-CM | POA: Diagnosis not present

## 2022-04-22 ENCOUNTER — Ambulatory Visit
Admission: RE | Admit: 2022-04-22 | Discharge: 2022-04-22 | Disposition: A | Discharge status: Home / Self Care | Payer: BC Managed Care – PPO | Source: Ambulatory Visit | Attending: Sports Medicine | Admitting: Sports Medicine

## 2022-04-22 DIAGNOSIS — R200 Anesthesia of skin: Secondary | ICD-10-CM

## 2022-04-22 DIAGNOSIS — M4727 Other spondylosis with radiculopathy, lumbosacral region: Secondary | ICD-10-CM | POA: Diagnosis not present

## 2022-04-22 DIAGNOSIS — M5116 Intervertebral disc disorders with radiculopathy, lumbar region: Secondary | ICD-10-CM | POA: Diagnosis not present

## 2022-04-23 DIAGNOSIS — R202 Paresthesia of skin: Secondary | ICD-10-CM | POA: Diagnosis not present

## 2022-04-26 DIAGNOSIS — R200 Anesthesia of skin: Secondary | ICD-10-CM | POA: Diagnosis not present

## 2022-04-27 ENCOUNTER — Other Ambulatory Visit: Payer: Self-pay | Admitting: Sports Medicine

## 2022-04-27 DIAGNOSIS — R200 Anesthesia of skin: Secondary | ICD-10-CM

## 2022-05-08 DIAGNOSIS — M4316 Spondylolisthesis, lumbar region: Secondary | ICD-10-CM | POA: Diagnosis not present

## 2022-05-08 DIAGNOSIS — Z6830 Body mass index (BMI) 30.0-30.9, adult: Secondary | ICD-10-CM | POA: Diagnosis not present

## 2022-05-18 DIAGNOSIS — M5416 Radiculopathy, lumbar region: Secondary | ICD-10-CM | POA: Diagnosis not present

## 2022-05-20 ENCOUNTER — Other Ambulatory Visit: Payer: BC Managed Care – PPO

## 2022-06-01 DIAGNOSIS — M25562 Pain in left knee: Secondary | ICD-10-CM | POA: Diagnosis not present

## 2022-06-07 DIAGNOSIS — G5700 Lesion of sciatic nerve, unspecified lower limb: Secondary | ICD-10-CM | POA: Diagnosis not present

## 2022-06-07 DIAGNOSIS — Z6830 Body mass index (BMI) 30.0-30.9, adult: Secondary | ICD-10-CM | POA: Diagnosis not present

## 2022-06-07 DIAGNOSIS — M4316 Spondylolisthesis, lumbar region: Secondary | ICD-10-CM | POA: Diagnosis not present

## 2022-06-15 DIAGNOSIS — M791 Myalgia, unspecified site: Secondary | ICD-10-CM | POA: Diagnosis not present

## 2022-06-22 DIAGNOSIS — Z1211 Encounter for screening for malignant neoplasm of colon: Secondary | ICD-10-CM | POA: Diagnosis not present

## 2022-06-27 DIAGNOSIS — M545 Low back pain, unspecified: Secondary | ICD-10-CM | POA: Diagnosis not present

## 2022-06-27 DIAGNOSIS — M79605 Pain in left leg: Secondary | ICD-10-CM | POA: Diagnosis not present

## 2022-07-17 DIAGNOSIS — M5416 Radiculopathy, lumbar region: Secondary | ICD-10-CM | POA: Diagnosis not present

## 2022-07-27 DIAGNOSIS — M5416 Radiculopathy, lumbar region: Secondary | ICD-10-CM | POA: Diagnosis not present

## 2022-08-01 DIAGNOSIS — M5416 Radiculopathy, lumbar region: Secondary | ICD-10-CM | POA: Diagnosis not present

## 2022-08-08 DIAGNOSIS — M5416 Radiculopathy, lumbar region: Secondary | ICD-10-CM | POA: Diagnosis not present

## 2022-08-10 DIAGNOSIS — M5416 Radiculopathy, lumbar region: Secondary | ICD-10-CM | POA: Diagnosis not present

## 2022-09-19 DIAGNOSIS — M545 Low back pain, unspecified: Secondary | ICD-10-CM | POA: Diagnosis not present

## 2022-09-19 DIAGNOSIS — L729 Follicular cyst of the skin and subcutaneous tissue, unspecified: Secondary | ICD-10-CM | POA: Diagnosis not present

## 2022-09-19 DIAGNOSIS — Z0000 Encounter for general adult medical examination without abnormal findings: Secondary | ICD-10-CM | POA: Diagnosis not present

## 2022-09-19 DIAGNOSIS — Z125 Encounter for screening for malignant neoplasm of prostate: Secondary | ICD-10-CM | POA: Diagnosis not present

## 2022-09-19 DIAGNOSIS — R350 Frequency of micturition: Secondary | ICD-10-CM | POA: Diagnosis not present

## 2022-09-19 DIAGNOSIS — E7800 Pure hypercholesterolemia, unspecified: Secondary | ICD-10-CM | POA: Diagnosis not present

## 2022-09-26 DIAGNOSIS — M1712 Unilateral primary osteoarthritis, left knee: Secondary | ICD-10-CM | POA: Diagnosis not present

## 2022-10-02 DIAGNOSIS — M4316 Spondylolisthesis, lumbar region: Secondary | ICD-10-CM | POA: Diagnosis not present

## 2022-10-02 DIAGNOSIS — Z6829 Body mass index (BMI) 29.0-29.9, adult: Secondary | ICD-10-CM | POA: Diagnosis not present

## 2022-10-05 ENCOUNTER — Telehealth (HOSPITAL_BASED_OUTPATIENT_CLINIC_OR_DEPARTMENT_OTHER): Payer: Self-pay

## 2022-10-05 ENCOUNTER — Other Ambulatory Visit (HOSPITAL_BASED_OUTPATIENT_CLINIC_OR_DEPARTMENT_OTHER): Payer: Self-pay | Admitting: Neurosurgery

## 2022-10-05 DIAGNOSIS — M4722 Other spondylosis with radiculopathy, cervical region: Secondary | ICD-10-CM

## 2022-10-05 DIAGNOSIS — M546 Pain in thoracic spine: Secondary | ICD-10-CM

## 2022-10-10 ENCOUNTER — Ambulatory Visit: Payer: BC Managed Care – PPO | Admitting: Podiatry

## 2022-10-10 ENCOUNTER — Ambulatory Visit (INDEPENDENT_AMBULATORY_CARE_PROVIDER_SITE_OTHER): Payer: BC Managed Care – PPO

## 2022-10-10 ENCOUNTER — Encounter: Payer: Self-pay | Admitting: Podiatry

## 2022-10-10 DIAGNOSIS — M778 Other enthesopathies, not elsewhere classified: Secondary | ICD-10-CM

## 2022-10-10 DIAGNOSIS — R2990 Unspecified symptoms and signs involving the nervous system: Secondary | ICD-10-CM | POA: Diagnosis not present

## 2022-10-10 DIAGNOSIS — G5792 Unspecified mononeuropathy of left lower limb: Secondary | ICD-10-CM

## 2022-10-10 DIAGNOSIS — M7752 Other enthesopathy of left foot: Secondary | ICD-10-CM | POA: Diagnosis not present

## 2022-10-10 MED ORDER — TRIAMCINOLONE ACETONIDE 10 MG/ML IJ SUSP
10.0000 mg | Freq: Once | INTRAMUSCULAR | Status: AC
  Administered 2022-10-10: 10 mg via INTRA_ARTICULAR

## 2022-10-10 NOTE — Progress Notes (Signed)
Subjective:   Patient ID: Brett Wall, male   DOB: 52 y.o.   MRN: [id number]   HPI Patient presents stating he has developed a lot of numbness in his lower legs this year and his hips and upper legs also.  States that he does feel some instability and he did have nerve conduction studies negative does have issues with his lower back and is due for MRI also has upper back and neck.  States that his left ankle seems to bother him also more than his right and he tried gabapentin and was not able to take it.  Patient does not smoke tries to be active   Review of Systems  All other systems reviewed and are negative.       Objective:  Physical Exam Vitals and nursing note reviewed.  Constitutional:      Appearance: He is well-developed.  Pulmonary:     Effort: Pulmonary effort is normal.  Musculoskeletal:        General: Normal range of motion.  Skin:    General: Skin is warm.  Neurological:     Mental Status: He is alert.     Vascular status intact diminishment sharp dull vibratory bilateral with the patient found to have multiple symptoms of a neuropathic condition which may be related to back may be idiopathic may be small fiber large fiber with inflammation of the left sinus tarsi     Assessment:  Difficult to make determination but concerns over the last 6 months of the condition which has developed and seems to be getting gradually worse with inflamed capsule left     Plan:  H&P x-rays reviewed and discussed condition at great length.  I am concerned about this and how it is intensified and I am going to go ahead today and I did nerve biopsies of the lower legs bilateral.  I numbed the area I then did a nerve biopsy removing the superficial nerve tissue placed in solution and sent for evaluation and we will reevaluate him in 1 month.  I did do sterile prep and injected the left ankle sinus tarsi 3 mg Kenalog 5 mg Xylocaine and instructed him on bandage usage for the areas where I  did the procedures.  Patient to be seen back and we will review findings  X-rays were negative for signs of arthritis fracture or other pathology associated with discomfort left over right sinus tarsi

## 2022-10-16 ENCOUNTER — Telehealth (HOSPITAL_BASED_OUTPATIENT_CLINIC_OR_DEPARTMENT_OTHER): Payer: Self-pay

## 2022-10-28 ENCOUNTER — Ambulatory Visit (HOSPITAL_BASED_OUTPATIENT_CLINIC_OR_DEPARTMENT_OTHER): Payer: BC Managed Care – PPO

## 2022-11-05 DIAGNOSIS — N4341 Spermatocele of epididymis, single: Secondary | ICD-10-CM | POA: Diagnosis not present

## 2022-11-07 DIAGNOSIS — M1712 Unilateral primary osteoarthritis, left knee: Secondary | ICD-10-CM | POA: Diagnosis not present

## 2022-11-09 ENCOUNTER — Ambulatory Visit: Payer: BC Managed Care – PPO | Admitting: Podiatry

## 2022-11-09 ENCOUNTER — Encounter: Payer: Self-pay | Admitting: Podiatry

## 2022-11-09 DIAGNOSIS — M7752 Other enthesopathy of left foot: Secondary | ICD-10-CM

## 2022-11-09 DIAGNOSIS — G5792 Unspecified mononeuropathy of left lower limb: Secondary | ICD-10-CM

## 2022-11-09 NOTE — Telephone Encounter (Signed)
Dr Lottie Rater from [facility] pathology returned your call about pt. He said you had questions about the results. He is not in the office on Monday if you could please call him Tuesday.

## 2022-11-11 NOTE — Progress Notes (Signed)
Subjective:   Patient ID: Brett Wall, male   DOB: 51 y.o.   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  Patient will be seen all questions answered may start him on gabapentin at 1 point but he will start supplements first before we would consider that and continue to pursue any issues with his back

## 2022-12-16 ENCOUNTER — Ambulatory Visit
Admission: RE | Admit: 2022-12-16 | Discharge: 2022-12-16 | Disposition: A | Discharge status: Home / Self Care | Payer: BC Managed Care – PPO | Source: Ambulatory Visit | Attending: Neurosurgery | Admitting: Neurosurgery

## 2022-12-16 DIAGNOSIS — M4802 Spinal stenosis, cervical region: Secondary | ICD-10-CM | POA: Diagnosis not present

## 2022-12-16 DIAGNOSIS — M546 Pain in thoracic spine: Secondary | ICD-10-CM

## 2022-12-16 DIAGNOSIS — M47814 Spondylosis without myelopathy or radiculopathy, thoracic region: Secondary | ICD-10-CM | POA: Diagnosis not present

## 2022-12-16 DIAGNOSIS — M5144 Schmorl's nodes, thoracic region: Secondary | ICD-10-CM | POA: Diagnosis not present

## 2022-12-16 DIAGNOSIS — M47812 Spondylosis without myelopathy or radiculopathy, cervical region: Secondary | ICD-10-CM | POA: Diagnosis not present

## 2022-12-16 DIAGNOSIS — M4722 Other spondylosis with radiculopathy, cervical region: Secondary | ICD-10-CM

## 2023-01-21 DIAGNOSIS — E7800 Pure hypercholesterolemia, unspecified: Secondary | ICD-10-CM | POA: Diagnosis not present

## 2023-02-07 DIAGNOSIS — Z6829 Body mass index (BMI) 29.0-29.9, adult: Secondary | ICD-10-CM | POA: Diagnosis not present

## 2023-02-07 DIAGNOSIS — M5416 Radiculopathy, lumbar region: Secondary | ICD-10-CM | POA: Diagnosis not present

## 2023-03-05 ENCOUNTER — Other Ambulatory Visit: Payer: Self-pay

## 2023-03-05 ENCOUNTER — Ambulatory Visit
Admission: RE | Admit: 2023-03-05 | Discharge: 2023-03-05 | Disposition: A | Discharge status: Home / Self Care | Payer: BC Managed Care – PPO | Source: Ambulatory Visit

## 2023-03-05 VITALS — BP 133/81 | HR 66 | Temp 97.9°F | Resp 16

## 2023-03-05 DIAGNOSIS — H6693 Otitis media, unspecified, bilateral: Secondary | ICD-10-CM | POA: Diagnosis not present

## 2023-03-05 MED ORDER — AMOXICILLIN-POT CLAVULANATE 875-125 MG PO TABS: 1.0000 | ORAL_TABLET | Freq: Two times a day (BID) | ORAL | 0 refills | Status: AC

## 2023-03-05 NOTE — ED Provider Notes (Signed)
 [facility]    CSN: [id number] Arrival date & time: 03/05/23  0945      History   Chief Complaint Chief Complaint  Patient presents with   Ear Fullness    HPI Brett Wall is a 53 y.o. male.   HPI 53 year old male presents with bilateral ear pain x 7 to 8 days PMH significant for HLD.  Past Medical History:  Diagnosis Date   Hyperlipidemia     Patient Active Problem List   Diagnosis Date Noted   Left knee pain 03/04/2014    Past Surgical History:  Procedure Laterality Date   HERNIA REPAIR     KNEE RECONSTRUCTION         Home Medications    Prior to Admission medications   Medication Sig Start Date End Date Taking? Authorizing Provider  amoxicillin -clavulanate (AUGMENTIN ) 875-125 MG tablet Take 1 tablet by mouth 2 (two) times daily for 10 days. 03/05/23 03/15/23 Yes Teddy Sharper, FNP  atorvastatin (LIPITOR) 20 MG tablet Take 20 mg by mouth daily.   Yes [provider]  VALIUM 5 MG tablet take 2 tablets by oral route1 hour before MRI scan 10/17/22   [provider]    Family History Family History  Problem Relation Age of Onset   Heart disease Mother     Social History Social History   Tobacco Use   Smoking status: Former   Smokeless tobacco: Never  Advertising Account Planner   Vaping status: Never Used  Substance Use Topics   Alcohol use: No   Drug use: No     Allergies   Patient has no known allergies.   Review of Systems Review of Systems  HENT:  Positive for ear pain.   All other systems reviewed and are negative.    Physical Exam Triage Vital Signs ED Triage Vitals  Encounter Vitals Group     BP 03/05/23 1016 133/81     Systolic BP Percentile --      Diastolic BP Percentile --      Pulse Rate 03/05/23 1016 66     Resp 03/05/23 1016 16     Temp 03/05/23 1016 97.9 F (36.6 C)     Temp src --      SpO2 03/05/23 1016 98 %     Weight --      Height --      Head Circumference --      Peak Flow --      Pain Score  03/05/23 1019 6     Pain Loc --      Pain Education --      Exclude from Growth Chart --    No data found.  Updated Vital Signs BP 133/81   Pulse 66   Temp 97.9 F (36.6 C)   Resp 16   SpO2 98%    Physical Exam Vitals and nursing note reviewed.  Constitutional:      Appearance: Normal appearance. He is obese.  HENT:     Head: Normocephalic and atraumatic.     Right Ear: Ear canal and external ear normal.     Left Ear: Ear canal and external ear normal.     Ears:     Comments: Right TM: Red rimmed retracted, good light reflex and mobility; Left TM: Erythematous, bulging    Mouth/Throat:     Mouth: Mucous membranes are moist.     Pharynx: Oropharynx is clear.  Eyes:     Extraocular Movements: Extraocular movements intact.  Conjunctiva/sclera: Conjunctivae normal.     Pupils: Pupils are equal, round, and reactive to light.  Cardiovascular:     Rate and Rhythm: Normal rate and regular rhythm.     Pulses: Normal pulses.     Heart sounds: Normal heart sounds.  Pulmonary:     Effort: Pulmonary effort is normal.     Breath sounds: Normal breath sounds. No wheezing, rhonchi or rales.  Musculoskeletal:        General: Normal range of motion.     Cervical back: Normal range of motion and neck supple.  Skin:    General: Skin is warm and dry.  Neurological:     General: No focal deficit present.     Mental Status: He is alert and oriented to person, place, and time. Mental status is at baseline.  Psychiatric:        Mood and Affect: Mood normal.        Behavior: Behavior normal.      UC Treatments / Results  Labs (all labs ordered are listed, but only abnormal results are displayed) Labs Reviewed - No data to display  EKG   Radiology No results found.  Procedures Procedures (including critical care time)  Medications Ordered in UC Medications - No data to display  Initial Impression / Assessment and Plan / UC Course  I have reviewed the triage vital  signs and the nursing notes.  Pertinent labs & imaging results that were available during my care of the patient were reviewed by me and considered in my medical decision making (see chart for details).     MDM: 1.  Acute bilateral otitis media-Rx'd Augmentin  875/125 mg tablet: Take 1 tablet twice daily x 10 days. Patient take medication as directed with food to completion.  Encouraged increase daily water intake to 64 ounces per day while taking this medication.  Advised if symptoms worsen and/or unresolved please follow-up with your PCP, ENT or here for further evaluation.  Patient discharged home, hemodynamically stable. Final Clinical Impressions(s) / UC Diagnoses   Final diagnoses:  Acute bilateral otitis media     Discharge Instructions      Patient take medication as directed with food to completion.  Encouraged increase daily water intake to 64 ounces per day while taking this medication.  Advised if symptoms worsen and/or unresolved please follow-up with your PCP, ENT or here for further evaluation.     ED Prescriptions     Medication Sig Dispense Auth. Provider   amoxicillin -clavulanate (AUGMENTIN ) 875-125 MG tablet Take 1 tablet by mouth 2 (two) times daily for 10 days. 20 tablet Nollie Terlizzi, FNP      PDMP not reviewed this encounter.   Teddy Sharper, FNP 03/05/23 1135

## 2023-03-05 NOTE — Discharge Instructions (Addendum)
 Patient take medication as directed with food to completion.  Encouraged increase daily water intake to 64 ounces per day while taking this medication.  Advised if symptoms worsen and/or unresolved please follow-up with your PCP, ENT or here for further evaluation.

## 2023-03-05 NOTE — ED Triage Notes (Signed)
X 7-8 days has had bil earaches. No fever. Has taken tylenol sinus.

## 2023-03-20 ENCOUNTER — Ambulatory Visit
Admission: RE | Admit: 2023-03-20 | Discharge: 2023-03-20 | Disposition: A | Discharge status: Home / Self Care | Payer: BC Managed Care – PPO | Source: Ambulatory Visit | Attending: Family Medicine | Admitting: Family Medicine

## 2023-03-20 VITALS — BP 141/81 | HR 76 | Temp 98.2°F | Resp 18

## 2023-03-20 DIAGNOSIS — H6993 Unspecified Eustachian tube disorder, bilateral: Secondary | ICD-10-CM

## 2023-03-20 DIAGNOSIS — E7800 Pure hypercholesterolemia, unspecified: Secondary | ICD-10-CM | POA: Diagnosis not present

## 2023-03-20 MED ORDER — PREDNISONE 20 MG PO TABS: ORAL_TABLET | ORAL | 0 refills | Status: AC

## 2023-03-20 MED ORDER — CEFDINIR 300 MG PO CAPS: 300.0000 mg | ORAL_CAPSULE | Freq: Two times a day (BID) | ORAL | 0 refills | Status: AC

## 2023-03-20 NOTE — ED Triage Notes (Signed)
 Pt with c/o bilateral ear fullness. States he was here a few weeks ago and placed on amoxicillin and feels like the fullness never went away.

## 2023-03-20 NOTE — Discharge Instructions (Signed)
 Take Pseudoephedrine (30mg , one or two every 4 to 6 hours) for sinus congestion.     May use Afrin nasal spray (or generic oxymetazoline) each morning for about 5 to 7 days and then discontinue.  Also recommend using saline nasal spray several times daily and saline nasal irrigation (AYR is a common brand).  Use Flonase nasal spray each morning after using Afrin nasal spray and saline nasal irrigation. Avoid all antihistamines for now, and other non-prescription cough/cold preparations.

## 2023-03-20 NOTE — ED Provider Notes (Signed)
 [facility]    CSN: [id number] Arrival date & time: 03/20/23  1353      History   Chief Complaint Chief Complaint  Patient presents with   Ear Fullness    HPI Brett Wall is a 53 y.o. male.   Patient was treated 15 days ago for otitis media with Augmentin for 10 days.  He states that after treatment his ear pain had resolved but his ears still feel clogged and he has difficulty equalizing ear pressure.  He still has some sinus congestion but feels well otherwise and denies fevers, chills, and sweats.  He states that his right ear feels more clogged than the left.  The history is provided by the patient.    Past Medical History:  Diagnosis Date   Hyperlipidemia     Patient Active Problem List   Diagnosis Date Noted   Left knee pain 03/04/2014    Past Surgical History:  Procedure Laterality Date   HERNIA REPAIR     KNEE RECONSTRUCTION         Home Medications    Prior to Admission medications   Medication Sig Start Date End Date Taking? Authorizing Provider  cefdinir (OMNICEF) 300 MG capsule Take 1 capsule (300 mg total) by mouth 2 (two) times daily. 03/20/23  Yes Lattie Haw, MD  predniSONE (DELTASONE) 20 MG tablet Take one tab by mouth twice daily for 4 days, then one daily for 3 days. Take with food. 03/20/23  Yes Lattie Haw, MD  atorvastatin (LIPITOR) 20 MG tablet Take 20 mg by mouth daily.    [provider]  VALIUM 5 MG tablet take 2 tablets by oral route1 hour before MRI scan 10/17/22   [provider]    Family History Family History  Problem Relation Age of Onset   Heart disease Mother     Social History Social History   Tobacco Use   Smoking status: Former   Smokeless tobacco: Never  Advertising account planner   Vaping status: Never Used  Substance Use Topics   Alcohol use: No   Drug use: No     Allergies   Patient has no known allergies.   Review of Systems Review of Systems No sore throat No cough No  pleuritic pain No wheezing + mild nasal congestion ? post-nasal drainage No sinus pain/pressure No itchy/red eyes No earache, but both ears feel clogged No hemoptysis No SOB No fever/chills No nausea No vomiting No abdominal pain No diarrhea No urinary symptoms No skin rash No fatigue No myalgias No headache   Physical Exam Triage Vital Signs ED Triage Vitals [03/20/23 1359]  Encounter Vitals Group     BP (!) 141/81     Systolic BP Percentile      Diastolic BP Percentile      Pulse Rate 76     Resp 18     Temp 98.2 F (36.8 C)     Temp Source Oral     SpO2 98 %     Weight      Height      Head Circumference      Peak Flow      Pain Score 0     Pain Loc      Pain Education      Exclude from Growth Chart    No data found.  Updated Vital Signs BP (!) 141/81 (BP Location: Left Arm)   Pulse 76   Temp 98.2 F (36.8 C) (Oral)  Resp 18   SpO2 98%   Visual Acuity Right Eye Distance:   Left Eye Distance:   Bilateral Distance:    Right Eye Near:   Left Eye Near:    Bilateral Near:     Physical Exam Nursing notes and Vital Signs reviewed. Appearance:  Patient appears stated age, and in no acute distress Eyes:  Pupils are equal, round, and reactive to light and accomodation.  Extraocular movement is intact.  Conjunctivae are not inflamed  Ears:  Canals normal.  Left tympanic membrane is mildly retracted but otherwise normal. Right tympanic membrane is scarred and retracted, and may have some effusion. Nose:  Congested turbinates.    Pharynx:  Normal Neck:  Supple.  No adenopathy Lungs:  Effort normal Heart:  Normal rate.   Skin:  No rash present.   UC Treatments / Results  Labs (all labs ordered are listed, but only abnormal results are displayed) Labs Reviewed - No data to display  EKG   Radiology No results found.  Procedures Procedures   Tympanometry:  Right ear tympanogram low peak height, large ear volume; Left ear tympanogram negative  peak pressure  Medications Ordered in UC Medications - No data to display  Initial Impression / Assessment and Plan / UC Course  I have reviewed the triage vital signs and the nursing notes.  Pertinent labs & imaging results that were available during my care of the patient were reviewed by me and considered in my medical decision making (see chart for details).    Persistent eustachian tube dysfunction.  Note that right tympanogram shows low peak height and large ear volume.  However, exam shows no obvious perforation although right TM is scarred. Begin prednisone burst/taper.  Will repeat antibiotic treatment with Omnicef for one week. Advised to use decongestant pseudoephedrine, decongestant nasal spray, and Flnase spray. Followup with ENT in one week.   Final Clinical Impressions(s) / UC Diagnoses   Final diagnoses:  Dysfunction of Eustachian tube, bilateral     Discharge Instructions      Take Pseudoephedrine (30mg , one or two every 4 to 6 hours) for sinus congestion.     May use Afrin nasal spray (or generic oxymetazoline) each morning for about 5 to 7 days and then discontinue.  Also recommend using saline nasal spray several times daily and saline nasal irrigation (AYR is a common brand).  Use Flonase nasal spray each morning after using Afrin nasal spray and saline nasal irrigation. Avoid all antihistamines for now, and other non-prescription cough/cold preparations.       ED Prescriptions     Medication Sig Dispense Auth. Provider   cefdinir (OMNICEF) 300 MG capsule Take 1 capsule (300 mg total) by mouth 2 (two) times daily. 14 capsule Lattie Haw, MD   predniSONE (DELTASONE) 20 MG tablet Take one tab by mouth twice daily for 4 days, then one daily for 3 days. Take with food. 11 tablet Lattie Haw, MD         Lattie Haw, MD 03/20/23 [phone]

## 2023-04-26 DIAGNOSIS — H6993 Unspecified Eustachian tube disorder, bilateral: Secondary | ICD-10-CM | POA: Diagnosis not present

## 2023-05-24 DIAGNOSIS — R945 Abnormal results of liver function studies: Secondary | ICD-10-CM | POA: Diagnosis not present

## 2023-06-12 DIAGNOSIS — M1712 Unilateral primary osteoarthritis, left knee: Secondary | ICD-10-CM | POA: Diagnosis not present

## 2023-06-12 DIAGNOSIS — M25562 Pain in left knee: Secondary | ICD-10-CM | POA: Diagnosis not present

## 2023-07-19 DIAGNOSIS — N50812 Left testicular pain: Secondary | ICD-10-CM | POA: Diagnosis not present

## 2023-07-19 DIAGNOSIS — N4341 Spermatocele of epididymis, single: Secondary | ICD-10-CM | POA: Diagnosis not present

## 2023-09-13 DIAGNOSIS — Z1159 Encounter for screening for other viral diseases: Secondary | ICD-10-CM | POA: Diagnosis not present

## 2023-09-13 DIAGNOSIS — Z125 Encounter for screening for malignant neoplasm of prostate: Secondary | ICD-10-CM | POA: Diagnosis not present

## 2023-09-13 DIAGNOSIS — E7800 Pure hypercholesterolemia, unspecified: Secondary | ICD-10-CM | POA: Diagnosis not present

## 2023-09-13 DIAGNOSIS — R945 Abnormal results of liver function studies: Secondary | ICD-10-CM | POA: Diagnosis not present

## 2023-09-17 DIAGNOSIS — N4341 Spermatocele of epididymis, single: Secondary | ICD-10-CM | POA: Diagnosis not present

## 2023-09-20 DIAGNOSIS — Z0189 Encounter for other specified special examinations: Secondary | ICD-10-CM | POA: Diagnosis not present

## 2023-09-20 DIAGNOSIS — E7800 Pure hypercholesterolemia, unspecified: Secondary | ICD-10-CM | POA: Diagnosis not present

## 2023-09-20 DIAGNOSIS — M25562 Pain in left knee: Secondary | ICD-10-CM | POA: Diagnosis not present

## 2023-09-20 DIAGNOSIS — R945 Abnormal results of liver function studies: Secondary | ICD-10-CM | POA: Diagnosis not present

## 2023-09-23 ENCOUNTER — Other Ambulatory Visit: Payer: Self-pay | Admitting: Family Medicine

## 2023-09-23 DIAGNOSIS — M25562 Pain in left knee: Secondary | ICD-10-CM

## 2023-09-27 ENCOUNTER — Ambulatory Visit
Admission: RE | Admit: 2023-09-27 | Discharge: 2023-09-27 | Disposition: A | Discharge status: Home / Self Care | Source: Ambulatory Visit | Attending: Family Medicine | Admitting: Family Medicine

## 2023-09-27 DIAGNOSIS — M25562 Pain in left knee: Secondary | ICD-10-CM

## 2023-11-22 DIAGNOSIS — E7800 Pure hypercholesterolemia, unspecified: Secondary | ICD-10-CM | POA: Diagnosis not present

## 2023-12-05 ENCOUNTER — Ambulatory Visit
Admission: RE | Admit: 2023-12-05 | Discharge: 2023-12-05 | Disposition: A | Source: Ambulatory Visit | Attending: Family Medicine | Admitting: Family Medicine

## 2023-12-05 ENCOUNTER — Other Ambulatory Visit: Payer: Self-pay | Admitting: Family Medicine

## 2023-12-05 DIAGNOSIS — M25552 Pain in left hip: Secondary | ICD-10-CM

## 2023-12-05 DIAGNOSIS — E7800 Pure hypercholesterolemia, unspecified: Secondary | ICD-10-CM | POA: Diagnosis not present

## 2023-12-05 DIAGNOSIS — M533 Sacrococcygeal disorders, not elsewhere classified: Secondary | ICD-10-CM

## 2023-12-05 DIAGNOSIS — M1612 Unilateral primary osteoarthritis, left hip: Secondary | ICD-10-CM | POA: Diagnosis not present

## 2023-12-11 DIAGNOSIS — M25462 Effusion, left knee: Secondary | ICD-10-CM | POA: Diagnosis not present

## 2023-12-11 DIAGNOSIS — M1712 Unilateral primary osteoarthritis, left knee: Secondary | ICD-10-CM | POA: Diagnosis not present

## 2024-01-10 DIAGNOSIS — M1712 Unilateral primary osteoarthritis, left knee: Secondary | ICD-10-CM | POA: Diagnosis not present

## 2024-01-13 ENCOUNTER — Other Ambulatory Visit: Payer: Self-pay | Admitting: Family Medicine

## 2024-01-13 DIAGNOSIS — M545 Low back pain, unspecified: Secondary | ICD-10-CM

## 2024-01-13 DIAGNOSIS — M25552 Pain in left hip: Secondary | ICD-10-CM

## 2024-01-13 DIAGNOSIS — R200 Anesthesia of skin: Secondary | ICD-10-CM

## 2024-01-13 DIAGNOSIS — M533 Sacrococcygeal disorders, not elsewhere classified: Secondary | ICD-10-CM

## 2024-01-13 DIAGNOSIS — R202 Paresthesia of skin: Secondary | ICD-10-CM

## 2024-01-14 DIAGNOSIS — G4733 Obstructive sleep apnea (adult) (pediatric): Secondary | ICD-10-CM | POA: Diagnosis not present

## 2024-01-14 DIAGNOSIS — Z87891 Personal history of nicotine dependence: Secondary | ICD-10-CM | POA: Diagnosis not present

## 2024-01-14 DIAGNOSIS — E785 Hyperlipidemia, unspecified: Secondary | ICD-10-CM | POA: Diagnosis not present

## 2024-01-16 DIAGNOSIS — E7800 Pure hypercholesterolemia, unspecified: Secondary | ICD-10-CM | POA: Diagnosis not present

## 2024-01-17 ENCOUNTER — Other Ambulatory Visit: Payer: Self-pay | Admitting: Family Medicine

## 2024-01-17 DIAGNOSIS — M25552 Pain in left hip: Secondary | ICD-10-CM

## 2024-01-17 DIAGNOSIS — M545 Low back pain, unspecified: Secondary | ICD-10-CM

## 2024-01-17 DIAGNOSIS — M533 Sacrococcygeal disorders, not elsewhere classified: Secondary | ICD-10-CM

## 2024-01-17 DIAGNOSIS — R200 Anesthesia of skin: Secondary | ICD-10-CM

## 2024-01-17 DIAGNOSIS — R202 Paresthesia of skin: Secondary | ICD-10-CM

## 2024-01-20 DIAGNOSIS — G4733 Obstructive sleep apnea (adult) (pediatric): Secondary | ICD-10-CM | POA: Diagnosis not present

## 2024-01-20 DIAGNOSIS — G8918 Other acute postprocedural pain: Secondary | ICD-10-CM | POA: Diagnosis not present

## 2024-01-20 DIAGNOSIS — Z79899 Other long term (current) drug therapy: Secondary | ICD-10-CM | POA: Diagnosis not present

## 2024-01-20 DIAGNOSIS — M1712 Unilateral primary osteoarthritis, left knee: Secondary | ICD-10-CM | POA: Diagnosis not present

## 2024-01-20 DIAGNOSIS — Z471 Aftercare following joint replacement surgery: Secondary | ICD-10-CM | POA: Diagnosis not present

## 2024-01-20 DIAGNOSIS — Z96652 Presence of left artificial knee joint: Secondary | ICD-10-CM | POA: Diagnosis not present

## 2024-01-21 DIAGNOSIS — G4733 Obstructive sleep apnea (adult) (pediatric): Secondary | ICD-10-CM | POA: Diagnosis not present

## 2024-01-21 DIAGNOSIS — M1712 Unilateral primary osteoarthritis, left knee: Secondary | ICD-10-CM | POA: Diagnosis not present

## 2024-01-21 DIAGNOSIS — Z79899 Other long term (current) drug therapy: Secondary | ICD-10-CM | POA: Diagnosis not present

## 2024-01-22 DIAGNOSIS — M1712 Unilateral primary osteoarthritis, left knee: Secondary | ICD-10-CM | POA: Diagnosis not present

## 2024-01-22 DIAGNOSIS — M25662 Stiffness of left knee, not elsewhere classified: Secondary | ICD-10-CM | POA: Diagnosis not present

## 2024-01-22 DIAGNOSIS — M25562 Pain in left knee: Secondary | ICD-10-CM | POA: Diagnosis not present

## 2024-01-22 DIAGNOSIS — R269 Unspecified abnormalities of gait and mobility: Secondary | ICD-10-CM | POA: Diagnosis not present

## 2024-02-08 ENCOUNTER — Ambulatory Visit
Admission: RE | Admit: 2024-02-08 | Discharge: 2024-02-08 | Disposition: A | Source: Ambulatory Visit | Attending: Family Medicine | Admitting: Family Medicine

## 2024-02-08 DIAGNOSIS — M545 Low back pain, unspecified: Secondary | ICD-10-CM

## 2024-02-08 DIAGNOSIS — R200 Anesthesia of skin: Secondary | ICD-10-CM

## 2024-02-08 DIAGNOSIS — M533 Sacrococcygeal disorders, not elsewhere classified: Secondary | ICD-10-CM

## 2024-02-08 DIAGNOSIS — M25552 Pain in left hip: Secondary | ICD-10-CM

## 2024-02-08 DIAGNOSIS — R202 Paresthesia of skin: Secondary | ICD-10-CM
